# Patient Record
Sex: Male | Born: 1955 | Race: White | Hispanic: No | Marital: Married | Smoking: Current every day smoker
Health system: Southern US, Community
[De-identification: ages and names within clinical notes are randomized; demographics above are authoritative.]

## PROBLEM LIST (undated history)

## (undated) DIAGNOSIS — K5780 Diverticulitis of intestine, part unspecified, with perforation and abscess without bleeding: Secondary | ICD-10-CM

## (undated) DIAGNOSIS — K5732 Diverticulitis of large intestine without perforation or abscess without bleeding: Secondary | ICD-10-CM

---

## 2003-12-14 ENCOUNTER — Other Ambulatory Visit: Payer: Self-pay

## 2009-10-03 ENCOUNTER — Emergency Department: Payer: Self-pay | Admitting: Orthopedic Surgery

## 2011-03-06 ENCOUNTER — Ambulatory Visit: Payer: Self-pay

## 2019-10-23 ENCOUNTER — Ambulatory Visit (INDEPENDENT_AMBULATORY_CARE_PROVIDER_SITE_OTHER)
Admission: EM | Admit: 2019-10-23 | Discharge: 2019-10-23 | Disposition: A | Discharge status: Home / Self Care | Payer: 59 | Source: Home / Self Care | Attending: Family Medicine | Admitting: Family Medicine

## 2019-10-23 ENCOUNTER — Inpatient Hospital Stay
Admission: EM | Admit: 2019-10-23 | Discharge: 2019-10-27 | DRG: 392 | Disposition: A | Discharge status: Home / Self Care | Payer: 59 | Attending: Surgery | Admitting: Surgery

## 2019-10-23 ENCOUNTER — Ambulatory Visit
Admit: 2019-10-23 | Discharge: 2019-10-23 | Disposition: A | Discharge status: Home / Self Care | Payer: 59 | Attending: Family Medicine | Admitting: Family Medicine

## 2019-10-23 ENCOUNTER — Ambulatory Visit (INDEPENDENT_AMBULATORY_CARE_PROVIDER_SITE_OTHER): Payer: 59

## 2019-10-23 ENCOUNTER — Other Ambulatory Visit: Payer: Self-pay

## 2019-10-23 ENCOUNTER — Telehealth: Payer: Self-pay | Admitting: Family Medicine

## 2019-10-23 ENCOUNTER — Encounter: Payer: Self-pay | Admitting: Intensive Care

## 2019-10-23 DIAGNOSIS — K4090 Unilateral inguinal hernia, without obstruction or gangrene, not specified as recurrent: Secondary | ICD-10-CM | POA: Diagnosis present

## 2019-10-23 DIAGNOSIS — R109 Unspecified abdominal pain: Secondary | ICD-10-CM

## 2019-10-23 DIAGNOSIS — R509 Fever, unspecified: Secondary | ICD-10-CM | POA: Diagnosis present

## 2019-10-23 DIAGNOSIS — R1084 Generalized abdominal pain: Secondary | ICD-10-CM | POA: Diagnosis not present

## 2019-10-23 DIAGNOSIS — F17210 Nicotine dependence, cigarettes, uncomplicated: Secondary | ICD-10-CM | POA: Diagnosis present

## 2019-10-23 DIAGNOSIS — K5720 Diverticulitis of large intestine with perforation and abscess without bleeding: Secondary | ICD-10-CM | POA: Diagnosis present

## 2019-10-23 DIAGNOSIS — R143 Flatulence: Secondary | ICD-10-CM | POA: Diagnosis not present

## 2019-10-23 DIAGNOSIS — K5900 Constipation, unspecified: Secondary | ICD-10-CM | POA: Diagnosis present

## 2019-10-23 DIAGNOSIS — R1030 Lower abdominal pain, unspecified: Secondary | ICD-10-CM | POA: Insufficient documentation

## 2019-10-23 DIAGNOSIS — K5792 Diverticulitis of intestine, part unspecified, without perforation or abscess without bleeding: Secondary | ICD-10-CM | POA: Diagnosis present

## 2019-10-23 DIAGNOSIS — N289 Disorder of kidney and ureter, unspecified: Secondary | ICD-10-CM | POA: Diagnosis present

## 2019-10-23 DIAGNOSIS — I700 Atherosclerosis of aorta: Secondary | ICD-10-CM | POA: Diagnosis present

## 2019-10-23 DIAGNOSIS — Z20822 Contact with and (suspected) exposure to covid-19: Secondary | ICD-10-CM | POA: Diagnosis present

## 2019-10-23 LAB — CBC WITH DIFFERENTIAL/PLATELET
Abs Immature Granulocytes: 0.09 10*3/uL — ABNORMAL HIGH (ref 0.00–0.07)
Basophils Absolute: 0 10*3/uL (ref 0.0–0.1)
Basophils Relative: 0 %
Eosinophils Absolute: 0.2 10*3/uL (ref 0.0–0.5)
Eosinophils Relative: 1 %
HCT: 43.3 % (ref 39.0–52.0)
Hemoglobin: 14.8 g/dL (ref 13.0–17.0)
Immature Granulocytes: 1 %
Lymphocytes Relative: 13 %
Lymphs Abs: 1.9 10*3/uL (ref 0.7–4.0)
MCH: 30.4 pg (ref 26.0–34.0)
MCHC: 34.2 g/dL (ref 30.0–36.0)
MCV: 88.9 fL (ref 80.0–100.0)
Monocytes Absolute: 1.3 10*3/uL — ABNORMAL HIGH (ref 0.1–1.0)
Monocytes Relative: 9 %
Neutro Abs: 11.2 10*3/uL — ABNORMAL HIGH (ref 1.7–7.7)
Neutrophils Relative %: 76 %
Platelets: 360 10*3/uL (ref 150–400)
RBC: 4.87 MIL/uL (ref 4.22–5.81)
RDW: 12.2 % (ref 11.5–15.5)
WBC: 14.7 10*3/uL — ABNORMAL HIGH (ref 4.0–10.5)
nRBC: 0 % (ref 0.0–0.2)

## 2019-10-23 LAB — COMPREHENSIVE METABOLIC PANEL
ALT: 64 U/L — ABNORMAL HIGH (ref 0–44)
AST: 46 U/L — ABNORMAL HIGH (ref 15–41)
Albumin: 3.9 g/dL (ref 3.5–5.0)
Alkaline Phosphatase: 79 U/L (ref 38–126)
Anion gap: 8 (ref 5–15)
BUN: 14 mg/dL (ref 8–23)
CO2: 25 mmol/L (ref 22–32)
Calcium: 9 mg/dL (ref 8.9–10.3)
Chloride: 102 mmol/L (ref 98–111)
Creatinine, Ser: 1.23 mg/dL (ref 0.61–1.24)
GFR calc Af Amer: >60 mL/min (ref >60)
GFR calc non Af Amer: >60 mL/min (ref >60)
Glucose, Bld: 101 mg/dL — ABNORMAL HIGH (ref 70–99)
Potassium: 4.2 mmol/L (ref 3.5–5.1)
Sodium: 135 mmol/L (ref 135–145)
Total Bilirubin: 1.8 mg/dL — ABNORMAL HIGH (ref 0.3–1.2)
Total Protein: 7.3 g/dL (ref 6.5–8.1)

## 2019-10-23 LAB — LIPASE, BLOOD: Lipase: 20 U/L (ref 11–51)

## 2019-10-23 LAB — SARS CORONAVIRUS 2 BY RT PCR (HOSPITAL ORDER, PERFORMED IN ~~LOC~~ HOSPITAL LAB): SARS Coronavirus 2: NEGATIVE

## 2019-10-23 LAB — LACTIC ACID, PLASMA: Lactic Acid, Venous: 1 mmol/L (ref 0.5–1.9)

## 2019-10-23 MED ORDER — ONDANSETRON 4 MG PO TBDP
4.0000 mg | ORAL_TABLET | Freq: Four times a day (QID) | ORAL | Status: DC | PRN
  Filled 2019-10-23: qty 1

## 2019-10-23 MED ORDER — PIPERACILLIN-TAZOBACTAM 3.375 G IVPB
3.3750 g | Freq: Three times a day (TID) | INTRAVENOUS | Status: DC
  Administered 2019-10-24 – 2019-10-27 (×9): 3.375 g via INTRAVENOUS
  Filled 2019-10-23 (×9): qty 50

## 2019-10-23 MED ORDER — ENOXAPARIN SODIUM 40 MG/0.4ML ~~LOC~~ SOLN
40.0000 mg | SUBCUTANEOUS | Status: DC
  Administered 2019-10-24 – 2019-10-26 (×3): 40 mg via SUBCUTANEOUS
  Filled 2019-10-23 (×3): qty 0.4

## 2019-10-23 MED ORDER — ACETAMINOPHEN 325 MG PO TABS: 650.0000 mg | ORAL_TABLET | Freq: Four times a day (QID) | ORAL | Status: DC | PRN

## 2019-10-23 MED ORDER — SODIUM CHLORIDE 0.9 % IV SOLN: Freq: Once | INTRAVENOUS | Status: AC

## 2019-10-23 MED ORDER — HYDROCODONE-ACETAMINOPHEN 5-325 MG PO TABS: 1.0000 | ORAL_TABLET | ORAL | Status: DC | PRN

## 2019-10-23 MED ORDER — PANTOPRAZOLE SODIUM 40 MG IV SOLR
40.0000 mg | Freq: Every day | INTRAVENOUS | Status: DC
  Administered 2019-10-23 – 2019-10-26 (×4): 40 mg via INTRAVENOUS
  Filled 2019-10-23 (×4): qty 40

## 2019-10-23 MED ORDER — IOHEXOL 300 MG/ML  SOLN
100.0000 mL | Freq: Once | INTRAMUSCULAR | Status: AC | PRN
  Administered 2019-10-23: 100 mL via INTRAVENOUS

## 2019-10-23 MED ORDER — MORPHINE SULFATE (PF) 2 MG/ML IV SOLN: 2.0000 mg | INTRAVENOUS | Status: DC | PRN

## 2019-10-23 MED ORDER — PIPERACILLIN-TAZOBACTAM 3.375 G IVPB 30 MIN
3.3750 g | Freq: Once | INTRAVENOUS | Status: AC
  Administered 2019-10-23: 3.375 g via INTRAVENOUS
  Filled 2019-10-23 (×2): qty 50

## 2019-10-23 MED ORDER — TRAMADOL HCL 50 MG PO TABS: 50.0000 mg | ORAL_TABLET | Freq: Four times a day (QID) | ORAL | Status: DC | PRN

## 2019-10-23 MED ORDER — ACETAMINOPHEN 325 MG PO TABS
ORAL_TABLET | ORAL | Status: AC
  Administered 2019-10-23: 650 mg via ORAL
  Filled 2019-10-23: qty 2

## 2019-10-23 MED ORDER — DOCUSATE SODIUM 100 MG PO CAPS: 100.0000 mg | ORAL_CAPSULE | Freq: Two times a day (BID) | ORAL | Status: DC | PRN

## 2019-10-23 MED ORDER — SODIUM CHLORIDE 0.9 % IV SOLN: INTRAVENOUS | Status: DC

## 2019-10-23 MED ORDER — ONDANSETRON HCL 4 MG/2ML IJ SOLN: 4.0000 mg | Freq: Four times a day (QID) | INTRAMUSCULAR | Status: DC | PRN

## 2019-10-23 NOTE — ED Triage Notes (Signed)
Pt reports having fever and abd pain x3 days. tmax 101.3 last night, reports taking advil and tylenol. Small BM this morning, last normal BM was Monday.

## 2019-10-23 NOTE — Discharge Instructions (Signed)
Go straight to the medical mall.  I will talk to you soon.  Take care  Dr. Adriana Simas

## 2019-10-23 NOTE — ED Provider Notes (Signed)
[facility]    CSN: [id number] Arrival date & time: 10/23/19  1157      History   Chief Complaint Chief Complaint  Patient presents with  . Fever  . Abdominal Pain   HPI  64 year old male presents with fever and abdominal pain.  Patient reports a 3-day history of abdominal pain and associated fever.  Patient localizes the abdominal pain to the lower abdomen.  He has had a fever as high as 101.3.  He has been taking Tylenol and Advil for his fever.  Patient reports ongoing constipation.  Patient states that he has never been constipated before.  He had a small bowel movement this morning.  No diarrhea.  Pain 4/10 in severity.  No known exacerbating factors.  Patient concerned given lack of improvement and persistent fever.  Denies history of diverticulitis.  Has never had abdominal surgery.  Patient does note that he has an inguinal hernia which is in need of repair.  He does not believe that this is the source of his abdominal pain and fever.  PMH: DDD, cervical; Hernia  Surgical Hx: Denies history of abdominal surgery.  Home Medications    Prior to Admission medications   Not on File   Social History Social History   Tobacco Use  . Smoking status: Current Every Day Smoker  . Smokeless tobacco: Never Used  Substance Use Topics  . Alcohol use: Yes  . Drug use: Never     Allergies   Patient has no allergy information on record.   Review of Systems Review of Systems  Constitutional: Positive for fever.  Gastrointestinal: Positive for abdominal pain and constipation.   Physical Exam Triage Vital Signs ED Triage Vitals  Enc Vitals Group     BP 10/23/19 1225 136/81     Pulse Rate 10/23/19 1225 85     Resp 10/23/19 1225 18     Temp 10/23/19 1225 99.2 F (37.3 C)     Temp Source 10/23/19 1225 Oral     SpO2 10/23/19 1225 96 %     Weight 10/23/19 1226 185 lb (83.9 kg)     Height 10/23/19 1226 5\' 9"  (1.753 m)     Head Circumference --      Peak Flow  --      Pain Score 10/23/19 1225 4     Pain Loc --      Pain Edu? --      Excl. in GC? --    Updated Vital Signs BP 136/81   Pulse 85   Temp 99.2 F (37.3 C) (Oral)   Resp 18   Ht 5\' 9"  (1.753 m)   Wt 83.9 kg   SpO2 96%   BMI 27.32 kg/m   Visual Acuity Right Eye Distance:   Left Eye Distance:   Bilateral Distance:    Right Eye Near:   Left Eye Near:    Bilateral Near:     Physical Exam Vitals and nursing note reviewed.  Constitutional:      General: He is not in acute distress.    Appearance: Normal appearance. He is not ill-appearing.  HENT:     Head: Normocephalic and atraumatic.  Eyes:     General:        Right eye: No discharge.        Left eye: No discharge.     Conjunctiva/sclera: Conjunctivae normal.  Cardiovascular:     Rate and Rhythm: Normal rate and regular rhythm.  Heart sounds: No murmur heard.   Pulmonary:     Effort: Pulmonary effort is normal.     Breath sounds: Normal breath sounds. No wheezing, rhonchi or rales.  Abdominal:     Palpations: Abdomen is soft.     Comments: Appears slightly distended.  Tender to palpation particularly in the periumbilical region as well as the left lower quadrant.  Neurological:     Mental Status: He is alert.  Psychiatric:        Mood and Affect: Mood normal.        Behavior: Behavior normal.    UC Treatments / Results  Labs (all labs ordered are listed, but only abnormal results are displayed) Labs Reviewed  CBC WITH DIFFERENTIAL/PLATELET - Abnormal; Notable for the following components:      Result Value   WBC 14.7 (*)    Neutro Abs 11.2 (*)    Monocytes Absolute 1.3 (*)    Abs Immature Granulocytes 0.09 (*)    All other components within normal limits  COMPREHENSIVE METABOLIC PANEL - Abnormal; Notable for the following components:   Glucose, Bld 101 (*)    AST 46 (*)    ALT 64 (*)    Total Bilirubin 1.8 (*)    All other components within normal limits  LIPASE, BLOOD     EKG   Radiology DG Abd 1 View  Result Date: 10/23/2019 CLINICAL DATA:  Fever and abdominal pain for 3 days. EXAM: ABDOMEN - 1 VIEW COMPARISON:  None. FINDINGS: The bowel gas pattern is normal. No radio-opaque calculi or other significant radiographic abnormality are seen. IMPRESSION: Negative exam. Electronically Signed   By: Drusilla Kanner M.D.   On: 10/23/2019 12:55   EXAM: CT ABDOMEN AND PELVIS WITH CONTRAST  TECHNIQUE: Multidetector CT imaging of the abdomen and pelvis was performed using the standard protocol following bolus administration of intravenous contrast.  CONTRAST: OMNIPAQUE IOHEXOL 300 MG/ML SOLN  COMPARISON: Plain film earlier today  FINDINGS: Lower chest: Clear lung bases. Normal heart size without pericardial or pleural effusion.  Hepatobiliary: Normal liver. Normal gallbladder, without biliary ductal dilatation.  Pancreas: Normal, without mass or ductal dilatation.  Spleen: Normal in size, without focal abnormality.  Adrenals/Urinary Tract: Normal adrenal glands. Fluid density right renal lesions are likely cysts and minimally complex cysts. Other lesions are too small to characterize. A lower pole right renal lesion of 1.7 cm demonstrates complexity along its anterior wall, including on 44/2. No hydronephrosis. The right-side of the urinary bladder enters a right inguinal hernia, including on 76/2.  Stomach/Bowel: Normal stomach, without wall thickening. Moderate pericolonic edema surrounds the sigmoid, at the site of diverticula. Extraluminal gas extends to the sigmoid mesocolon and into the abdominal retroperitoneum, including to the level of the right adrenal gland.  No pericolonic fluid collection. Normal terminal ileum and appendix. Normal small bowel.  Vascular/Lymphatic: Aortic atherosclerosis. No abdominopelvic adenopathy.  Reproductive: Normal prostate.  Other: No significant free fluid.  Musculoskeletal: Degenerative  partial fusion of the right sacroiliac joint.  IMPRESSION: 1. Moderate sigmoid diverticulitis with perforation and gas extending into the abdominal retroperitoneum. 2. Right inguinal hernia containing fat and a portion of the urinary bladder. 3. Complex lower pole right renal lesion, most likely a minimally complex cyst. Consider further evaluation with pre and post contrast abdominal MRI at 6-12 months. 4. Aortic Atherosclerosis (ICD10-I70.0).  These results will be called to the ordering clinician or representative by the Radiologist Assistant, and communication documented in the PACS or [employer].  Electronically Signed By: Jeronimo Greaves M.D. On: 10/23/2019 17:11  Procedures Procedures (including critical care time)  Medications Ordered in UC Medications - No data to display  Initial Impression / Assessment and Plan / UC Course  I have reviewed the triage vital signs and the nursing notes.  Pertinent labs & imaging results that were available during my care of the patient were reviewed by me and considered in my medical decision making (see chart for details).    64 year old male presents with fever and abdominal pain.  CT unavailable at urgent care today.  Laboratory studies obtained and revealed leukocytosis with left shift with a white blood cell count of 14.7.  Metabolic panel notable for mildly elevated LFTs as well as bilirubin of 1.8.  Creatinine within normal limits.  KUB obtained and independently interpreted by me.  Normal gas pattern.  No evidence of obstruction.  Given history, clinical exam and laboratory studies patient is being sent for CT abdomen pelvis with contrast (at [facility]) to further evaluate the etiology of his abdominal pain and fever.  ** Update - 1720: CT results return. Patient has diverticulitis with perforation. CT transporting patient to ER.  ER notified.  This is an emergent condition that will require hospitalization and surgical  evaluation.  Final Clinical Impressions(s) / UC Diagnoses   Final diagnoses:  Abdominal pain  Lower abdominal pain  Fever, unspecified fever cause     Discharge Instructions     Go straight to the medical mall.  I will talk to you soon.  Take care  Dr. Adriana Simas    ED Prescriptions    None     PDMP not reviewed this encounter.   Tommie Sams, [place] 10/23/19 1724

## 2019-10-23 NOTE — Telephone Encounter (Signed)
PA # for CT scan: [id number] effective 10/23/2019-01/21/2020

## 2019-10-23 NOTE — ED Provider Notes (Signed)
[facility] Emergency Department Provider Note ____________________________________________   First MD Initiated Contact with Patient 10/23/19 1742     (approximate)  I have reviewed the triage vital signs and the nursing notes.   HISTORY  Chief Complaint Abdominal Pain    HPI Joshua Hayes is a 64 y.o. male with no active medical problems and no prior surgical history who presents with abdominal pain over the last 4 days, gradual onset, mainly in the left lower quadrant, and associated with a fever to as high as 101 as well as constipation.  The patient denies any prior history of similar pain.  He has had no vomiting or diarrhea.  He was seen at urgent care today, and a CT showed perforated diverticulitis.  History reviewed. No pertinent past medical history.  Patient Active Problem List   Diagnosis Date Noted  . Diverticulitis 10/23/2019    History reviewed. No pertinent surgical history.  Prior to Admission medications   Medication Sig Start Date End Date Taking? Authorizing Provider  tiZANidine (ZANAFLEX) 4 MG capsule Take by mouth. 09/10/19  Yes [provider]    Allergies Patient has no known allergies.  History reviewed. No pertinent family history.  Social History Social History   Tobacco Use  . Smoking status: Current Every Day Smoker    Types: Cigarettes  . Smokeless tobacco: Never Used  Substance Use Topics  . Alcohol use: Yes    Alcohol/week: 2.0 standard drinks    Types: 2 Cans of beer per week  . Drug use: Never    Review of Systems  Constitutional: Positive for fevers. Eyes: No redness. ENT: No sore throat. Cardiovascular: Denies chest pain. Respiratory: Denies shortness of breath. Gastrointestinal: No vomiting or diarrhea.  Positive constipation. Genitourinary: Negative for dysuria.  Musculoskeletal: Negative for back pain. Skin: Negative for rash. Neurological: Negative for  headache.   ____________________________________________   PHYSICAL EXAM:  VITAL SIGNS: ED Triage Vitals  Enc Vitals Group     BP      Pulse      Resp      Temp      Temp src      SpO2      Weight      Height      Head Circumference      Peak Flow      Pain Score      Pain Loc      Pain Edu?      Excl. in GC?     Constitutional: Alert and oriented. Well appearing and in no acute distress. Eyes: Conjunctivae are normal.  Head: Atraumatic. Nose: No congestion/rhinnorhea. Mouth/Throat: Mucous membranes are moist.   Neck: Normal range of motion.  Cardiovascular: Normal rate, regular rhythm.   Good peripheral circulation. Respiratory: Normal respiratory effort.  No retractions.  Gastrointestinal: Soft with mild left lower quadrant tenderness. Genitourinary: No flank tenderness. Musculoskeletal: No lower extremity edema.  Extremities warm and well perfused.  Neurologic:  Normal speech and language. No gross focal neurologic deficits are appreciated.  Skin:  Skin is warm and dry. No rash noted. Psychiatric: Mood and affect are normal. Speech and behavior are normal.  ____________________________________________   LABS (all labs ordered are listed, but only abnormal results are displayed)  Labs Reviewed  SARS CORONAVIRUS 2 BY RT PCR (HOSPITAL ORDER, PERFORMED IN [facility])  LACTIC ACID, PLASMA  LACTIC ACID, PLASMA  HIV ANTIBODY (ROUTINE TESTING W REFLEX)  BASIC METABOLIC PANEL  MAGNESIUM  PHOSPHORUS  CBC   ____________________________________________  EKG  ED ECG REPORT I, Dionne Bucy, the attending physician, personally viewed and interpreted this ECG.  Date: 10/23/2019 EKG Time: 1816 Rate: 81 Rhythm: normal sinus rhythm QRS Axis: Borderline left axis Intervals: normal ST/T Wave abnormalities: normal Narrative Interpretation: no evidence of acute  ischemia  ____________________________________________  RADIOLOGY    ____________________________________________   PROCEDURES  Procedure(s) performed: No  Procedures  Critical Care performed: No ____________________________________________   INITIAL IMPRESSION / ASSESSMENT AND PLAN / ED COURSE  Pertinent labs & imaging results that were available during my care of the patient were reviewed by me and considered in my medical decision making (see chart for details).  64 year old male with no active medical problems and no previous abdominal surgical history presents with abdominal pain over the last several days associated with constipation and fever.  He was seen in urgent care, and CT shows the following:  IMPRESSION: 1. Moderate sigmoid diverticulitis with perforation and gas extending into the abdominal retroperitoneum. 2. Right inguinal hernia containing fat and a portion of the urinary bladder. 3. Complex lower pole right renal lesion, most likely a minimally complex cyst. Consider further evaluation with pre and post contrast abdominal MRI at 6-12 months.  Labs earlier today are significant for WBC count of 14.7.  I will add on a lactate, give fluids, empiric antibiotics, and consult general surgery.  ----------------------------------------- 7:41 PM on 10/23/2019 -----------------------------------------  Dr. Tonna Boehringer evaluated the patient in the ED and will admit him.  ____________________________________________   FINAL CLINICAL IMPRESSION(S) / ED DIAGNOSES  Final diagnoses:  Diverticulitis of large intestine with perforation without bleeding      NEW MEDICATIONS STARTED DURING THIS VISIT:  New Prescriptions   No medications on file     Note:  This document was prepared using Dragon voice recognition software and may include unintentional dictation errors.    Dionne Bucy, MD 10/23/19 1942

## 2019-10-23 NOTE — ED Triage Notes (Signed)
Pt sent for abnormal CT scan. Fevers and abd pain X3 days. Last normal BM was monday

## 2019-10-23 NOTE — H&P (Signed)
Subjective:   CC: Diverticulitis  HPI:  Joshua Hayes is a 64 y.o. male who was consulted by [facility] for evaluation of above.  First noted 3 days ago.  Symptoms include: Pain is sharp, intermittent, exacerbating episodes but baseline pain.  Exacerbated by nothing specific.  Alleviated by nothing specific.  Associated with diarrhea.  Never had a colonoscopy before   Past Medical History: None reported  Past Surgical History: None reported  Family History: Reviewed and not relevant to current chief complaint  Social History:  reports that he has been smoking cigarettes. He has never used smokeless tobacco. He reports current alcohol use of about 2.0 standard drinks of alcohol per week. He reports that he does not use drugs.  Current Medications: Tylenol Motrin as needed  Allergies:  No Known Allergies  ROS:  General: Denies weight loss, weight gain, fatigue, fevers, chills, and night sweats. Eyes: Denies blurry vision, double vision, eye pain, itchy eyes, and tearing. Ears: Denies hearing loss, earache, and ringing in ears. Nose: Denies sinus pain, congestion, infections, runny nose, and nosebleeds. Mouth/throat: Denies hoarseness, sore throat, bleeding gums, and difficulty swallowing. Heart: Denies chest pain, palpitations, racing heart, irregular heartbeat, leg pain or swelling, and decreased activity tolerance. Respiratory: Denies breathing difficulty, shortness of breath, wheezing, cough, and sputum. GI: Denies change in appetite, heartburn, nausea, vomiting, constipation, and blood in stool. GU: Denies difficulty urinating, pain with urinating, urgency, frequency, blood in urine. Musculoskeletal: Denies joint stiffness, pain, swelling, muscle weakness. Skin: Denies rash, itching, mass, tumors, sores, and boils Neurologic: Denies headache, fainting, dizziness, seizures, numbness, and tingling. Psychiatric: Denies depression, anxiety, difficulty sleeping, and memory  loss. Endocrine: Denies heat or cold intolerance, and increased thirst or urination. Blood/lymph: Denies easy bruising, easy bruising, and swollen glands     Objective:     BP (!) 143/93 (BP Location: Right Arm)   Pulse 81   Temp 98.4 F (36.9 C) (Oral)   Resp 18   Ht 5\' 9"  (1.753 m)   Wt 83.9 kg   SpO2 97%   BMI 27.32 kg/m   Constitutional :  alert, cooperative, appears stated age and no distress  Lymphatics/Throat:  no asymmetry, masses, or scars  Respiratory:  clear to auscultation bilaterally  Cardiovascular:  regular rate and rhythm  Gastrointestinal: abnormal findings:  Soft, no guarding, but moderate focal tenderness in the left lower quadrant and suprapubic area.  Musculoskeletal: Steady gait and movement  Skin: Cool and moist  Psychiatric: Normal affect, non-agitated, not confused       LABS:  CMP Latest Ref Rng & Units 10/23/2019  Glucose 70 - 99 mg/dL 10/25/2019)  BUN 8 - 23 mg/dL 14  Creatinine 220(U - 5.42 mg/dL 7.06  Sodium 2.37 - 628 mmol/L 135  Potassium 3.5 - 5.1 mmol/L 4.2  Chloride 98 - 111 mmol/L 102  CO2 22 - 32 mmol/L 25  Calcium 8.9 - 10.3 mg/dL 9.0  Total Protein 6.5 - 8.1 g/dL 7.3  Total Bilirubin 0.3 - 1.2 mg/dL 315)  Alkaline Phos 38 - 126 U/L 79  AST 15 - 41 U/L 46(H)  ALT 0 - 44 U/L 64(H)   CBC Latest Ref Rng & Units 10/23/2019  WBC 4.0 - 10.5 K/uL 14.7(H)  Hemoglobin 13.0 - 17.0 g/dL 10/25/2019  Hematocrit 39 - 52 % 43.3  Platelets 150 - 400 K/uL 360    RADS: Result Date: 10/23/2019 CLINICAL DATA:  Fever and abdominal pain for 3 days. EXAM: ABDOMEN - 1 VIEW COMPARISON:  None. FINDINGS: The bowel gas pattern is normal. No radio-opaque calculi or other significant radiographic abnormality are seen. IMPRESSION: Negative exam. Electronically Signed   By: Drusilla Kanner M.D.   On: 10/23/2019 12:55    EXAM: CT ABDOMEN AND PELVIS WITH CONTRAST  TECHNIQUE: Multidetector CT imaging of the abdomen and pelvis was performed using the standard  protocol following bolus administration of intravenous contrast.  CONTRAST: OMNIPAQUE IOHEXOL 300 MG/ML SOLN  COMPARISON: Plain film earlier today  FINDINGS: Lower chest: Clear lung bases. Normal heart size without pericardial or pleural effusion.  Hepatobiliary: Normal liver. Normal gallbladder, without biliary ductal dilatation.  Pancreas: Normal, without mass or ductal dilatation.  Spleen: Normal in size, without focal abnormality.  Adrenals/Urinary Tract: Normal adrenal glands. Fluid density right renal lesions are likely cysts and minimally complex cysts. Other lesions are too small to characterize. A lower pole right renal lesion of 1.7 cm demonstrates complexity along its anterior wall, including on 44/2. No hydronephrosis. The right-side of the urinary bladder enters a right inguinal hernia, including on 76/2.  Stomach/Bowel: Normal stomach, without wall thickening. Moderate pericolonic edema surrounds the sigmoid, at the site of diverticula. Extraluminal gas extends to the sigmoid mesocolon and into the abdominal retroperitoneum, including to the level of the right adrenal gland.  No pericolonic fluid collection. Normal terminal ileum and appendix. Normal small bowel.  Vascular/Lymphatic: Aortic atherosclerosis. No abdominopelvic adenopathy.  Reproductive: Normal prostate.  Other: No significant free fluid.  Musculoskeletal: Degenerative partial fusion of the right sacroiliac joint.  IMPRESSION: 1. Moderate sigmoid diverticulitis with perforation and gas extending into the abdominal retroperitoneum. 2. Right inguinal hernia containing fat and a portion of the urinary bladder. 3. Complex lower pole right renal lesion, most likely a minimally complex cyst. Consider further evaluation with pre and post contrast abdominal MRI at 6-12 months. 4. Aortic Atherosclerosis (ICD10-I70.0).  These results will be called to the ordering clinician  or representative by the Radiologist Assistant, and communication documented in the PACS or [employer].   Electronically Signed By: Jeronimo Greaves M.D. On: 10/23/2019 17:11   Assessment:      Acute diverticulitis with perforation Right renal lesion, likely cyst  Plan:     Despite the leukocytosis, and the moderate amount of pneumoperitoneum extending along the retroperitoneal cavity, patient vitals remained stable, total duration of symptoms has been 3 days, and the patient is tender to palpation but in no way has an acute abdomen.  We discussed the risk benefits and alternatives at this point including observation with IV antibiotics versus proceeding with surgical management.  Surgical management at this point will have a high risk of requiring an ostomy.  I believe that we can give IV antibiotics to try for the next 24 to 48 hours to see if he has any significant improvement in his symptoms prior to really pushing for surgery.  Patient is agreeable to that at this time especially with the potential need for a temporary ostomy after such surgery.  We will admit for IV fluids, Zosyn, n.p.o. for now, serial abdominal exams.  Incidental note of right renal lesion, likely cyst per report.  Will make sure he has follow-up MRI in 6 to 12 months to ensure no change.  All questions and concerns addressed at this time.

## 2019-10-24 LAB — CBC
HCT: 41 % (ref 39.0–52.0)
Hemoglobin: 13.9 g/dL (ref 13.0–17.0)
MCH: 30.8 pg (ref 26.0–34.0)
MCHC: 33.9 g/dL (ref 30.0–36.0)
MCV: 90.9 fL (ref 80.0–100.0)
Platelets: 326 10*3/uL (ref 150–400)
RBC: 4.51 MIL/uL (ref 4.22–5.81)
RDW: 12.1 % (ref 11.5–15.5)
WBC: 11.5 10*3/uL — ABNORMAL HIGH (ref 4.0–10.5)
nRBC: 0 % (ref 0.0–0.2)

## 2019-10-24 LAB — BASIC METABOLIC PANEL
Anion gap: 6 (ref 5–15)
BUN: 12 mg/dL (ref 8–23)
CO2: 28 mmol/L (ref 22–32)
Calcium: 8.9 mg/dL (ref 8.9–10.3)
Chloride: 104 mmol/L (ref 98–111)
Creatinine, Ser: 1.27 mg/dL — ABNORMAL HIGH (ref 0.61–1.24)
GFR calc Af Amer: >60 mL/min (ref >60)
GFR calc non Af Amer: >60 mL/min (ref >60)
Glucose, Bld: 84 mg/dL (ref 70–99)
Potassium: 4.9 mmol/L (ref 3.5–5.1)
Sodium: 138 mmol/L (ref 135–145)

## 2019-10-24 LAB — MAGNESIUM: Magnesium: 2.1 mg/dL (ref 1.7–2.4)

## 2019-10-24 LAB — HIV ANTIBODY (ROUTINE TESTING W REFLEX): HIV Screen 4th Generation wRfx: NONREACTIVE

## 2019-10-24 LAB — PHOSPHORUS: Phosphorus: 3.5 mg/dL (ref 2.5–4.6)

## 2019-10-24 MED ORDER — MELATONIN 5 MG PO TABS
10.0000 mg | ORAL_TABLET | Freq: Every evening | ORAL | Status: DC | PRN
  Administered 2019-10-24 – 2019-10-26 (×3): 10 mg via ORAL
  Filled 2019-10-24 (×3): qty 2

## 2019-10-24 NOTE — Progress Notes (Signed)
Subjective:  CC: Joshua Hayes is a 64 y.o. male  Hospital stay day 1,   diverticulitis  HPI: No issues overnight.  Pain improved.  ROS:  General: Denies weight loss, weight gain, fatigue, fevers, chills, and night sweats. Heart: Denies chest pain, palpitations, racing heart, irregular heartbeat, leg pain or swelling, and decreased activity tolerance. Respiratory: Denies breathing difficulty, shortness of breath, wheezing, cough, and sputum. GI: Denies change in appetite, heartburn, nausea, vomiting, constipation, diarrhea, and blood in stool. GU: Denies difficulty urinating, pain with urinating, urgency, frequency, blood in urine.   Objective:   Temp:  [97.9 F (36.6 C)-99.5 F (37.5 C)] 98.7 F (37.1 C) (07/16 1128) Pulse Rate:  [68-95] 73 (07/16 1128) Resp:  [18-20] 18 (07/16 1128) BP: (109-155)/(67-96) 116/77 (07/16 1128) SpO2:  [96 %-100 %] 98 % (07/16 1128) Weight:  [83.2 kg-83.9 kg] 83.2 kg (07/15 2351)     Height: 5\' 9"  (175.3 cm) Weight: 83.2 kg BMI (Calculated): 27.09   Intake/Output this shift:   Intake/Output Summary (Last 24 hours) at 10/24/2019 1541 Last data filed at 10/24/2019 1201 Gross per 24 hour  Intake 302.17 ml  Output 750 ml  Net -447.83 ml    Constitutional :  alert, cooperative, appears stated age and no distress  Respiratory:  clear to auscultation bilaterally  Cardiovascular:  regular rate and rhythm  Gastrointestinal: soft, no guarding, TTP in suprapubic, LLQ, improved but still present on exam.   Skin: Cool and moist.   Psychiatric: Normal affect, non-agitated, not confused       LABS:  CMP Latest Ref Rng & Units 10/24/2019 10/23/2019  Glucose 70 - 99 mg/dL 84 10/25/2019)  BUN 8 - 23 mg/dL 12 14  Creatinine 818(H - 1.24 mg/dL 6.31) 4.97(W  Sodium 2.63 - 145 mmol/L 138 135  Potassium 3.5 - 5.1 mmol/L 4.9 4.2  Chloride 98 - 111 mmol/L 104 102  CO2 22 - 32 mmol/L 28 25  Calcium 8.9 - 10.3 mg/dL 8.9 9.0  Total Protein 6.5 - 8.1 g/dL - 7.3  Total  Bilirubin 0.3 - 1.2 mg/dL - 1.8(H)  Alkaline Phos 38 - 126 U/L - 79  AST 15 - 41 U/L - 46(H)  ALT 0 - 44 U/L - 64(H)   CBC Latest Ref Rng & Units 10/24/2019 10/23/2019  WBC 4.0 - 10.5 K/uL 11.5(H) 14.7(H)  Hemoglobin 13.0 - 17.0 g/dL 10/25/2019 88.5  Hematocrit 39 - 52 % 41.0 43.3  Platelets 150 - 400 K/uL 326 360    RADS: n/a Assessment:   Diverticulitis.  Wbc count and tenderness decreasing.  Continue IV abx.  Maybe clears later tonight, but likely tomorrow.  Will like to go slow due to extensive nature noted on CT.

## 2019-10-24 NOTE — Progress Notes (Signed)
Mobility Specialist - Progress Note   10/24/19 0927  Mobility  Activity Ambulated in hall;Ambulated in room  Level of Assistance Independent (CGA for safety)  Assistive Device None  Distance Ambulated (ft) 210 ft  Mobility Response Tolerated well  Mobility performed by Mobility specialist  $Mobility charge 1 Mobility    Pre-mobility: 73 HR, 122/81 BP, 99% SpO2 Post-mobility: 73 HR, 139/74 BP, 98% SpO2  Pt was in bed upon arrival. Denies having pain, only discomfort, but agreed to ambulate. Pt was able to mobilize and ambulate independently with CGA for safety. After session, pt laid back in bed with call bell in reach. Nurse was notified.    [name] Mobility Specialist  10/24/19, 9:36 AM

## 2019-10-24 NOTE — Plan of Care (Signed)
  Problem: Education: Goal: Knowledge of General Education information will improve Description Including pain rating scale, medication(s)/side effects and non-pharmacologic comfort measures Outcome: Progressing   

## 2019-10-24 NOTE — Progress Notes (Signed)
Initial Nutrition Assessment  DOCUMENTATION CODES:   Not applicable  INTERVENTION:  Monitor for diet advancement per surgery recommendations, provide oral nutrition supplement as appropriate.    NUTRITION DIAGNOSIS:   Inadequate oral intake related to acute illness (acute diverticulits with perforation) as evidenced by NPO status.   GOAL:   Patient will meet greater than or equal to 90% of their needs    MONITOR:   Diet advancement, Labs, Weight trends, I & O's  REASON FOR ASSESSMENT:   Malnutrition Screening Tool    ASSESSMENT:  RD working remotely.  64 year old male with no significant past medical history admitted with perforated diverticulitis presented with 4 day history of abdominal pain associated with fever and constipation.  Surgery consulted, surgical management will have high risk of requiring ostomy, plans for IV antibiotics for the next 24-48 hrs to see if pt has improvement before surgical intervention. Patient is NPO, will monitor for diet advancement and provide oral nutrition supplement as appropriate.   No weight history available for review Medications reviewed and include: IVF: NaCl @ 100 ml/hr IVPB: Zosyn Labs: Cr 1.27 (H), WBC 11.5 (H)   NUTRITION - FOCUSED PHYSICAL EXAM: Unable to complete at this time, RD working remotely.  Diet Order:   Diet Order            Diet NPO time specified Except for: Sips with Meds  Diet effective now                 EDUCATION NEEDS:   No education needs have been identified at this time  Skin:  Skin Assessment: Reviewed RN Assessment  Last BM:  pta  Height:   Ht Readings from Last 1 Encounters:  10/23/19 5\' 9"  (1.753 m)    Weight:   Wt Readings from Last 1 Encounters:  10/23/19 83.2 kg    Ideal Body Weight:  72.7 kg  BMI:  Body mass index is 27.1 kg/m.  Estimated Nutritional Needs:   Kcal:  2100-2300  Protein:  105-115  Fluid:  >/= 2.1 L/day   10/25/19, RD,  LDN Clinical Nutrition After Hours/Weekend Pager # in Amion

## 2019-10-25 DIAGNOSIS — K5720 Diverticulitis of large intestine with perforation and abscess without bleeding: Principal | ICD-10-CM

## 2019-10-25 LAB — CBC
HCT: 41 % (ref 39.0–52.0)
Hemoglobin: 14.3 g/dL (ref 13.0–17.0)
MCH: 30.9 pg (ref 26.0–34.0)
MCHC: 34.9 g/dL (ref 30.0–36.0)
MCV: 88.6 fL (ref 80.0–100.0)
Platelets: 365 10*3/uL (ref 150–400)
RBC: 4.63 MIL/uL (ref 4.22–5.81)
RDW: 12.1 % (ref 11.5–15.5)
WBC: 10.4 10*3/uL (ref 4.0–10.5)
nRBC: 0 % (ref 0.0–0.2)

## 2019-10-25 LAB — BASIC METABOLIC PANEL
Anion gap: 10 (ref 5–15)
BUN: 17 mg/dL (ref 8–23)
CO2: 26 mmol/L (ref 22–32)
Calcium: 9.1 mg/dL (ref 8.9–10.3)
Chloride: 102 mmol/L (ref 98–111)
Creatinine, Ser: 1.27 mg/dL — ABNORMAL HIGH (ref 0.61–1.24)
GFR calc Af Amer: >60 mL/min (ref >60)
GFR calc non Af Amer: >60 mL/min (ref >60)
Glucose, Bld: 91 mg/dL (ref 70–99)
Potassium: 3.9 mmol/L (ref 3.5–5.1)
Sodium: 138 mmol/L (ref 135–145)

## 2019-10-25 LAB — MAGNESIUM: Magnesium: 2.3 mg/dL (ref 1.7–2.4)

## 2019-10-25 LAB — PHOSPHORUS: Phosphorus: 3.8 mg/dL (ref 2.5–4.6)

## 2019-10-25 MED ORDER — SIMETHICONE 80 MG PO CHEW
80.0000 mg | CHEWABLE_TABLET | Freq: Four times a day (QID) | ORAL | Status: DC | PRN
  Administered 2019-10-26: 80 mg via ORAL
  Filled 2019-10-25 (×3): qty 1

## 2019-10-25 NOTE — Progress Notes (Signed)
10/25/2019  Subjective: No acute events overnight.  Patient reports that he is feeling better today and pain is continuing to improve.  Vital signs: Temp:  [97.9 F (36.6 C)-99.1 F (37.3 C)] 97.9 F (36.6 C) (07/17 1135) Pulse Rate:  [65-84] 84 (07/17 1135) Resp:  [17-20] 20 (07/17 1135) BP: (109-119)/(64-75) 111/75 (07/17 1135) SpO2:  [96 %-98 %] 96 % (07/17 0718) Weight:  [82.1 kg] 82.1 kg (07/17 0513)   Intake/Output: 07/16 0701 - 07/17 0700 In: 744.6 [I.V.:668.8; IV Piggyback:75.8] Out: 950 [Urine:950] Last BM Date: 10/23/19  Physical Exam: Constitutional: No acute distress Abdomen: Soft, nondistended, with mild tenderness to palpation but not peritoneal at this point.  Labs:  Recent Labs    10/24/19 0408 10/25/19 0303  WBC 11.5* 10.4  HGB 13.9 14.3  HCT 41.0 41.0  PLT 326 365   Recent Labs    10/24/19 0408 10/25/19 0303  NA 138 138  K 4.9 3.9  CL 104 102  CO2 28 26  GLUCOSE 84 91  BUN 12 17  CREATININE 1.27* 1.27*  CALCIUM 8.9 9.1   No results for input(s): LABPROT, INR in the last 72 hours.  Imaging: No results found.  Assessment/Plan: This is a 64 y.o. male with acute diverticulitis with perforation and foci of gas extending to the retroperitoneum.  -Currently patient is improving well with conservative management.  His white blood cell count has normalized.  His pain is improving. -Continue clear liquid diet today.  We will advance very slowly given his presentation. -Continue IV antibiotics.   Howie Ill, MD [facility]

## 2019-10-26 LAB — CBC
HCT: 40.6 % (ref 39.0–52.0)
Hemoglobin: 14.3 g/dL (ref 13.0–17.0)
MCH: 31 pg (ref 26.0–34.0)
MCHC: 35.2 g/dL (ref 30.0–36.0)
MCV: 88.1 fL (ref 80.0–100.0)
Platelets: 372 10*3/uL (ref 150–400)
RBC: 4.61 MIL/uL (ref 4.22–5.81)
RDW: 11.9 % (ref 11.5–15.5)
WBC: 9.7 10*3/uL (ref 4.0–10.5)
nRBC: 0 % (ref 0.0–0.2)

## 2019-10-26 LAB — BASIC METABOLIC PANEL
Anion gap: 8 (ref 5–15)
BUN: 14 mg/dL (ref 8–23)
CO2: 25 mmol/L (ref 22–32)
Calcium: 9 mg/dL (ref 8.9–10.3)
Chloride: 106 mmol/L (ref 98–111)
Creatinine, Ser: 1.33 mg/dL — ABNORMAL HIGH (ref 0.61–1.24)
GFR calc Af Amer: >60 mL/min (ref >60)
GFR calc non Af Amer: 57 mL/min — ABNORMAL LOW (ref >60)
Glucose, Bld: 105 mg/dL — ABNORMAL HIGH (ref 70–99)
Potassium: 4 mmol/L (ref 3.5–5.1)
Sodium: 139 mmol/L (ref 135–145)

## 2019-10-26 LAB — MAGNESIUM: Magnesium: 2.2 mg/dL (ref 1.7–2.4)

## 2019-10-26 LAB — PHOSPHORUS: Phosphorus: 4.1 mg/dL (ref 2.5–4.6)

## 2019-10-26 NOTE — Plan of Care (Signed)
Continuing with plan of care. 

## 2019-10-26 NOTE — Progress Notes (Signed)
10/26/2019  Subjective: No acute events overnight.  Patient reports that he has some gas pains yesterday but he still having flatus and had 2 bowel movements.  WBC remains normal this morning.  No fevers.  Reports that his pain overall is better today.  Vital signs: Temp:  [97.9 F (36.6 C)-99.3 F (37.4 C)] 98.6 F (37 C) (07/18 0733) Pulse Rate:  [70-84] 71 (07/18 0733) Resp:  [18-20] 20 (07/18 0733) BP: (107-118)/(69-83) 118/83 (07/18 0733) SpO2:  [95 %-100 %] 95 % (07/18 0733) Weight:  [81.9 kg] 81.9 kg (07/18 0419)   Intake/Output: 07/17 0701 - 07/18 0700 In: 464.3 [P.O.:240; IV Piggyback:224.3] Out: 300 [Urine:300] Last BM Date: 10/25/19  Physical Exam: Constitutional: No acute distress Abdomen: Soft, nondistended, with mild discomfort to palpation in the periumbilical and left lower quadrant areas.  No peritonitis.  Labs:  Recent Labs    10/25/19 0303 10/26/19 0414  WBC 10.4 9.7  HGB 14.3 14.3  HCT 41.0 40.6  PLT 365 372   Recent Labs    10/25/19 0303 10/26/19 0414  NA 138 139  K 3.9 4.0  CL 102 106  CO2 26 25  GLUCOSE 91 105*  BUN 17 14  CREATININE 1.27* 1.33*  CALCIUM 9.1 9.0   No results for input(s): LABPROT, INR in the last 72 hours.  Imaging: No results found.  Assessment/Plan: This is a 64 y.o. male with diverticulitis with perforation foci of gas extending to the retroperitoneum.  --Patient improving overall with normal white blood cell count today again. -We will advance diet to full liquid diet and keep that for today. -Continue IV antibiotics.   Howie Ill, MD [facility]

## 2019-10-27 LAB — CBC
HCT: 41.3 % (ref 39.0–52.0)
Hemoglobin: 14.5 g/dL (ref 13.0–17.0)
MCH: 31.2 pg (ref 26.0–34.0)
MCHC: 35.1 g/dL (ref 30.0–36.0)
MCV: 88.8 fL (ref 80.0–100.0)
Platelets: 373 10*3/uL (ref 150–400)
RBC: 4.65 MIL/uL (ref 4.22–5.81)
RDW: 11.9 % (ref 11.5–15.5)
WBC: 9.3 10*3/uL (ref 4.0–10.5)
nRBC: 0 % (ref 0.0–0.2)

## 2019-10-27 LAB — BASIC METABOLIC PANEL
Anion gap: 7 (ref 5–15)
BUN: 12 mg/dL (ref 8–23)
CO2: 27 mmol/L (ref 22–32)
Calcium: 9.2 mg/dL (ref 8.9–10.3)
Chloride: 104 mmol/L (ref 98–111)
Creatinine, Ser: 1.42 mg/dL — ABNORMAL HIGH (ref 0.61–1.24)
GFR calc Af Amer: >60 mL/min (ref >60)
GFR calc non Af Amer: 53 mL/min — ABNORMAL LOW (ref >60)
Glucose, Bld: 106 mg/dL — ABNORMAL HIGH (ref 70–99)
Potassium: 3.9 mmol/L (ref 3.5–5.1)
Sodium: 138 mmol/L (ref 135–145)

## 2019-10-27 LAB — MAGNESIUM: Magnesium: 2.1 mg/dL (ref 1.7–2.4)

## 2019-10-27 LAB — PHOSPHORUS: Phosphorus: 4.4 mg/dL (ref 2.5–4.6)

## 2019-10-27 MED ORDER — AMOXICILLIN-POT CLAVULANATE 875-125 MG PO TABS: 1.0000 | ORAL_TABLET | Freq: Two times a day (BID) | ORAL | 0 refills | Status: AC

## 2019-10-27 MED ORDER — LACTATED RINGERS IV BOLUS
500.0000 mL | Freq: Once | INTRAVENOUS | Status: AC
  Administered 2019-10-27: 500 mL via INTRAVENOUS

## 2019-10-27 NOTE — Discharge Summary (Signed)
Physician Discharge Summary  Patient ID: Joshua Hayes MRN: [id number] DOB/AGE: November 16, 1955 64 y.o.  Admit date: 10/23/2019 Discharge date: 10/27/19   Admission Diagnoses: diverticuilitis  Discharge Diagnoses:  Same as above  Discharged Condition: good  Hospital Course: admitted for above.  Recovered with IV abx and bowel rest, then gradual advancement of diet.  At time of discharge, tolerating regular food, pain controlled.  Cr level noted to be slightly increasing, recommended increased fluid intake.  Will recheck at next appt.  Consults: None  Discharge Exam: Blood pressure 114/73, pulse 78, temperature 98.3 F (36.8 C), temperature source Oral, resp. rate 16, height 5\' 9"  (1.753 m), weight 81.9 kg, SpO2 96 %. General appearance: alert, cooperative and no distress GI: soft, no guarding, barely noticeable TTP in LLQ  Disposition:  Discharge disposition: 01-Home or Self Care       Discharge Instructions    Discharge patient   Complete by: As directed    Discharge disposition: 01-Home or Self Care   Discharge patient date: 10/27/2019     Allergies as of 10/27/2019   No Known Allergies     Medication List    TAKE these medications   amoxicillin-clavulanate 875-125 MG tablet Commonly known as: Augmentin Take 1 tablet by mouth 2 (two) times daily for 6 days.   tiZANidine 4 MG capsule Commonly known as: ZANAFLEX Take by mouth.       Follow-up Information    10/29/2019, Everardo Voris, DO Follow up in 2 week(s).   Specialty: Surgery Why: f/u diverticulitis Contact information: [address] [place] [phone]                Total time spent arranging discharge was >62min. Signed: 31m 10/27/2019, 7:32 AM

## 2019-10-27 NOTE — Discharge Instructions (Signed)

## 2019-10-27 NOTE — Progress Notes (Signed)
Pt discharged per MD order. IV removed. Discharge instructions reviewed with pt. Pt verbalized understanding. Pt declined wheelchair and chose to walk downstairs to car.

## 2019-11-11 ENCOUNTER — Other Ambulatory Visit: Payer: Self-pay

## 2019-11-11 ENCOUNTER — Encounter: Payer: Self-pay | Admitting: Intensive Care

## 2019-11-11 ENCOUNTER — Emergency Department
Admission: EM | Admit: 2019-11-11 | Discharge: 2019-11-12 | Disposition: A | Discharge status: Home / Self Care | Payer: 59 | Attending: Emergency Medicine | Admitting: Emergency Medicine

## 2019-11-11 DIAGNOSIS — R1030 Lower abdominal pain, unspecified: Secondary | ICD-10-CM | POA: Diagnosis present

## 2019-11-11 DIAGNOSIS — Z5321 Procedure and treatment not carried out due to patient leaving prior to being seen by health care provider: Secondary | ICD-10-CM | POA: Insufficient documentation

## 2019-11-11 DIAGNOSIS — R509 Fever, unspecified: Secondary | ICD-10-CM | POA: Diagnosis not present

## 2019-11-11 HISTORY — DX: Diverticulitis of intestine, part unspecified, with perforation and abscess without bleeding: K57.80

## 2019-11-11 LAB — COMPREHENSIVE METABOLIC PANEL
ALT: 21 U/L (ref 0–44)
AST: 20 U/L (ref 15–41)
Albumin: 4.2 g/dL (ref 3.5–5.0)
Alkaline Phosphatase: 61 U/L (ref 38–126)
Anion gap: 9 (ref 5–15)
BUN: 13 mg/dL (ref 8–23)
CO2: 27 mmol/L (ref 22–32)
Calcium: 9.7 mg/dL (ref 8.9–10.3)
Chloride: 104 mmol/L (ref 98–111)
Creatinine, Ser: 1.3 mg/dL — ABNORMAL HIGH (ref 0.61–1.24)
GFR calc Af Amer: >60 mL/min (ref >60)
GFR calc non Af Amer: 59 mL/min — ABNORMAL LOW (ref >60)
Glucose, Bld: 125 mg/dL — ABNORMAL HIGH (ref 70–99)
Potassium: 4.7 mmol/L (ref 3.5–5.1)
Sodium: 140 mmol/L (ref 135–145)
Total Bilirubin: 1.2 mg/dL (ref 0.3–1.2)
Total Protein: 7.2 g/dL (ref 6.5–8.1)

## 2019-11-11 LAB — CBC
HCT: 42.8 % (ref 39.0–52.0)
Hemoglobin: 14.3 g/dL (ref 13.0–17.0)
MCH: 30.4 pg (ref 26.0–34.0)
MCHC: 33.4 g/dL (ref 30.0–36.0)
MCV: 90.9 fL (ref 80.0–100.0)
Platelets: 566 10*3/uL — ABNORMAL HIGH (ref 150–400)
RBC: 4.71 MIL/uL (ref 4.22–5.81)
RDW: 11.8 % (ref 11.5–15.5)
WBC: 15.2 10*3/uL — ABNORMAL HIGH (ref 4.0–10.5)
nRBC: 0 % (ref 0.0–0.2)

## 2019-11-11 LAB — LIPASE, BLOOD: Lipase: 23 U/L (ref 11–51)

## 2019-11-11 LAB — LACTIC ACID, PLASMA: Lactic Acid, Venous: 2.3 mmol/L (ref 0.5–1.9)

## 2019-11-11 MED ORDER — OXYCODONE-ACETAMINOPHEN 5-325 MG PO TABS
1.0000 | ORAL_TABLET | ORAL | Status: DC | PRN
  Administered 2019-11-11: 1 via ORAL
  Filled 2019-11-11: qty 1

## 2019-11-11 NOTE — ED Triage Notes (Signed)
Patient admitted on 10/23/19 and diagnosed with Moderate sigmoid diverticulitis with perforation and gas extending into the abdominal retroperitoneum. Patient back today due to lower abd pain and fever that started today. Reports 101.2 fever at home

## 2019-11-12 ENCOUNTER — Ambulatory Visit: Payer: 59

## 2019-11-12 ENCOUNTER — Ambulatory Visit
Admission: RE | Admit: 2019-11-12 | Discharge: 2019-11-12 | Disposition: A | Discharge status: Home / Self Care | Payer: 59 | Source: Ambulatory Visit | Attending: Surgery | Admitting: Surgery

## 2019-11-12 ENCOUNTER — Other Ambulatory Visit: Payer: Self-pay | Admitting: Surgery

## 2019-11-12 DIAGNOSIS — R1032 Left lower quadrant pain: Secondary | ICD-10-CM

## 2019-11-12 DIAGNOSIS — K5792 Diverticulitis of intestine, part unspecified, without perforation or abscess without bleeding: Secondary | ICD-10-CM

## 2019-11-12 MED ORDER — IOHEXOL 300 MG/ML  SOLN
100.0000 mL | Freq: Once | INTRAMUSCULAR | Status: AC | PRN
  Administered 2019-11-12: 100 mL via INTRAVENOUS

## 2019-11-13 NOTE — Consult Note (Signed)
Subjective:   CC: abd pain, fever  HPI:  Joshua Hayes is a 64 y.o. male who was consulted by Cyril Loosen for issue above.  Symptoms were first noted this am  ago. Pain is sharp, similar to previous diverticular episode, confined to the suprapubic/LLQ area, without radiation.  Associated with nothing specific, exacerbated by palpation.  Still having normal BM, tolerating diet.  Pain lessens with meds.     Past Medical History:  has a past medical history of Perforated diverticulum.  Past Surgical History: none reported  Family History: reviewed and not relevant  Social History:  reports that he has been smoking cigarettes. He has never used smokeless tobacco. He reports current alcohol use of about 2.0 standard drinks of alcohol per week. He reports that he does not use drugs.  Current Medications:  Prior to Admission medications   Medication Sig Start Date End Date Taking? Authorizing Provider  tiZANidine (ZANAFLEX) 4 MG capsule Take by mouth. 09/10/19   [provider]    Allergies:  Allergies as of 11/11/2019  . (No Known Allergies)    ROS:  General: Denies weight loss, weight gain, fatigue, fevers, chills, and night sweats. Eyes: Denies blurry vision, double vision, eye pain, itchy eyes, and tearing. Ears: Denies hearing loss, earache, and ringing in ears. Nose: Denies sinus pain, congestion, infections, runny nose, and nosebleeds. Mouth/throat: Denies hoarseness, sore throat, bleeding gums, and difficulty swallowing. Heart: Denies chest pain, palpitations, racing heart, irregular heartbeat, leg pain or swelling, and decreased activity tolerance. Respiratory: Denies breathing difficulty, shortness of breath, wheezing, cough, and sputum. GI: Denies change in appetite, heartburn, nausea, vomiting, constipation, diarrhea, and blood in stool. GU: Denies difficulty urinating, pain with urinating, urgency, frequency, blood in urine. Musculoskeletal: Denies joint stiffness, pain,  swelling, muscle weakness. Skin: Denies rash, itching, mass, tumors, sores, and boils Neurologic: Denies headache, fainting, dizziness, seizures, numbness, and tingling. Psychiatric: Denies depression, anxiety, difficulty sleeping, and memory loss. Endocrine: Denies heat or cold intolerance, and increased thirst or urination. Blood/lymph: Denies easy bruising, easy bruising, and swollen glands     Objective:     BP (!) 112/97 (BP Location: Left Arm)   Pulse 95   Temp 99.3 F (37.4 C) (Oral)   Resp 16   Ht 5\' 9"  (1.753 m)   Wt 81.9 kg   SpO2 98%   BMI 26.67 kg/m   Constitutional :  alert, cooperative, appears stated age and no distress  Lymphatics/Throat:  no asymmetry, masses, or scars  Respiratory:  clear to auscultation bilaterally  Cardiovascular:  regular rate and rhythm  Gastrointestinal: soft, focal guarding in suprapubic region.   Musculoskeletal: Steady movement  Skin: Cool and moist   Psychiatric: Normal affect, non-agitated, not confused       LABS:  CMP Latest Ref Rng & Units 11/11/2019 10/27/2019 10/26/2019  Glucose 70 - 99 mg/dL 10/28/2019) 474(Q) 595(G)  BUN 8 - 23 mg/dL 13 12 14   Creatinine 0.61 - 1.24 mg/dL 387(F) ) 6.43(P)  Sodium 135 - 145 mmol/L 140 138 139  Potassium 3.5 - 5.1 mmol/L 4.7 3.9 4.0  Chloride 98 - 111 mmol/L 104 104 106  CO2 22 - 32 mmol/L 27 27 25   Calcium 8.9 - 10.3 mg/dL 9.7 9.2 9.0  Total Protein 6.5 - 8.1 g/dL 7.2 - -  Total Bilirubin 0.3 - 1.2 mg/dL 1.2 - -  Alkaline Phos 38 - 126 U/L 61 - -  AST 15 - 41 U/L 20 - -  ALT 0 -  44 U/L 21 - -   CBC Latest Ref Rng & Units 11/11/2019 10/27/2019 10/26/2019  WBC 4.0 - 10.5 K/uL 15.2(H) 9.3 9.7  Hemoglobin 13.0 - 17.0 g/dL 78.5 88.5 02.7  Hematocrit 39 - 52 % 42.8 41.3 40.6  Platelets 150 - 400 K/uL 566(H) 373 372    RADS: Pending CT Assessment:   Abdominal pain and reported fever, likely recurrent diverticulitis  Plan:   CT a/p pending.  Will discuss care moving forward depending  on severity noted on CT.  Patient currently comfortable, tolerating diet and pain with outpt meds so that is reassuring, but may need to consider inpt abx, and/or IR guided drainage if an abscess has formed.  I encouraged patient to wait at least until CT a/p is done.  He verbalized understanding.   Discussed pathophisiology and treatment options including NG tube decompression and possible surgery for lysis of adhesions, laparoscopic or open.  The risk of surgery include, but not limited to, recurrence, bleeding, chronic pain, post-op infxn, post-op SBO or ileus, hernias, resection of bowel, re-anastamosis, possible ostomy placement and need for re-operation to address said risks. The risks of general anesthetic, if used, includes MI, CVA, sudden death or even reaction to anesthetic medications also discussed. Alternatives include continued observation and NG decompression.  Benefits include possible symptom relief, preventing further decline in health and possible death.  Typical post-op recovery time of additional days in hospital for observation afterwards also discussed.  The patient verbalized understanding and all questions were answered to the patient's satisfaction.

## 2019-12-30 ENCOUNTER — Other Ambulatory Visit: Payer: 59

## 2020-01-20 ENCOUNTER — Other Ambulatory Visit: Payer: Self-pay

## 2020-01-20 ENCOUNTER — Other Ambulatory Visit
Admission: RE | Admit: 2020-01-20 | Discharge: 2020-01-20 | Disposition: A | Discharge status: Home / Self Care | Payer: 59 | Source: Ambulatory Visit | Attending: Surgery | Admitting: Surgery

## 2020-01-20 DIAGNOSIS — Z01812 Encounter for preprocedural laboratory examination: Secondary | ICD-10-CM | POA: Insufficient documentation

## 2020-01-20 DIAGNOSIS — Z20822 Contact with and (suspected) exposure to covid-19: Secondary | ICD-10-CM | POA: Diagnosis not present

## 2020-01-21 ENCOUNTER — Encounter: Payer: Self-pay | Admitting: Surgery

## 2020-01-21 LAB — SARS CORONAVIRUS 2 (TAT 6-24 HRS): SARS Coronavirus 2: NEGATIVE

## 2020-01-22 ENCOUNTER — Ambulatory Visit: Payer: 59 | Admitting: Registered Nurse

## 2020-01-22 ENCOUNTER — Encounter: Payer: Self-pay | Admitting: Surgery

## 2020-01-22 ENCOUNTER — Ambulatory Visit
Admission: RE | Admit: 2020-01-22 | Discharge: 2020-01-22 | Disposition: A | Discharge status: Home / Self Care | Payer: 59 | Attending: Surgery | Admitting: Surgery

## 2020-01-22 ENCOUNTER — Encounter
Admission: RE | Disposition: A | Discharge status: Home / Self Care | Payer: Self-pay | Source: Home / Self Care | Attending: Surgery

## 2020-01-22 ENCOUNTER — Other Ambulatory Visit: Payer: Self-pay

## 2020-01-22 DIAGNOSIS — Z885 Allergy status to narcotic agent status: Secondary | ICD-10-CM | POA: Insufficient documentation

## 2020-01-22 DIAGNOSIS — K5732 Diverticulitis of large intestine without perforation or abscess without bleeding: Secondary | ICD-10-CM | POA: Diagnosis present

## 2020-01-22 DIAGNOSIS — K635 Polyp of colon: Secondary | ICD-10-CM | POA: Diagnosis not present

## 2020-01-22 DIAGNOSIS — F17200 Nicotine dependence, unspecified, uncomplicated: Secondary | ICD-10-CM | POA: Insufficient documentation

## 2020-01-22 DIAGNOSIS — K5730 Diverticulosis of large intestine without perforation or abscess without bleeding: Secondary | ICD-10-CM | POA: Diagnosis not present

## 2020-01-22 DIAGNOSIS — K640 First degree hemorrhoids: Secondary | ICD-10-CM | POA: Diagnosis not present

## 2020-01-22 DIAGNOSIS — Z888 Allergy status to other drugs, medicaments and biological substances status: Secondary | ICD-10-CM | POA: Insufficient documentation

## 2020-01-22 HISTORY — PX: COLONOSCOPY WITH PROPOFOL: SHX5780

## 2020-01-22 HISTORY — DX: Diverticulitis of large intestine without perforation or abscess without bleeding: K57.32

## 2020-01-22 SURGERY — COLONOSCOPY WITH PROPOFOL
Anesthesia: General

## 2020-01-22 MED ORDER — SODIUM CHLORIDE 0.9 % IV SOLN
INTRAVENOUS | Status: DC
  Administered 2020-01-22: 1000 mL via INTRAVENOUS

## 2020-01-22 MED ORDER — PROPOFOL 500 MG/50ML IV EMUL
INTRAVENOUS | Status: DC | PRN
  Administered 2020-01-22: 140 ug/kg/min via INTRAVENOUS

## 2020-01-22 MED ORDER — PROPOFOL 10 MG/ML IV BOLUS
INTRAVENOUS | Status: DC | PRN
  Administered 2020-01-22: 20 mg via INTRAVENOUS
  Administered 2020-01-22: 80 mg via INTRAVENOUS
  Administered 2020-01-22: 20 mg via INTRAVENOUS

## 2020-01-22 NOTE — H&P (Signed)
CC: Diverticulitis of large intestine with perforation without abscess or bleeding [K57.20]   HPI: Joshua Hayes is a 64 y.o. male who is here for followup from above. Second round of abx worked well. No further pain episodes. Normal BMs.   Current Medications: has a current medication list which includes the following prescription(s): psyllium (sugar).  Allergies:  Allergies  Allergen Reactions  . Tramadol Dizziness   ROS: General: Denies weight loss, weight gain, fatigue, fevers, chills, and night sweats. Heart: Denies chest pain, palpitations, racing heart, irregular heartbeat, leg pain or swelling, and decreased activity tolerance. Respiratory: Denies breathing difficulty, shortness of breath, wheezing, cough, and sputum. GI: Denies change in appetite, heartburn, nausea, vomiting, constipation, diarrhea, and blood in stool. GU: Denies difficulty urinating, pain with urinating, urgency, frequency, blood in urine   Objective:    BP 137/87  Pulse 82  Ht 175.3 cm (5\' 9" )  Wt 79.4 kg (175 lb)  BMI 25.84 kg/m   Constitutional : alert, appears stated age, cooperative and no distress  Gastrointestinal: soft, non-tender; bowel sounds normal; no masses, no organomegaly.  Musculoskeletal: Steady gait and movement  Skin: Cool and moist, incisions clean, dry, intact. No erythema, induration or drainage to indicate infection.  Psychiatric: Normal affect, non-agitated, not confused    LABS:  N/A   RADS: N/A  Assessment:    Diverticulitis of large intestine with perforation without abscess or bleeding [K57.20]  Plan:    1. Healing well. No issues. Will proceed with scheduling colonoscopy. R/b/a discussed. Risks include bleeding, perforation. Benefits include diagnostic, curative procedure if needed. Alternatives include continued observation. Pt verbalized understanding.

## 2020-01-22 NOTE — Op Note (Signed)
[facility] Patient Name: Castle Lamons Procedure Date: 01/22/2020 7:36 AM MRN: [id number] Account #: [id number] Date of Birth: 1956/02/15 Admit Type: Outpatient Age: 64 Room: [facility] ENDO ROOM 3 Gender: Male Note Status: Finalized Procedure:             Colonoscopy Indications:           Diverticulosis of the colon Providers:             Arville Go MD, MD Referring MD:          No Local Md, MD (Referring MD) Medicines:             Propofol per Anesthesia Complications:         No immediate complications. Procedure:             Pre-Anesthesia Assessment:                        - After reviewing the risks and benefits, the patient                         was deemed in satisfactory condition to undergo the                         procedure in an ambulatory setting.                        After obtaining informed consent, the colonoscope was                         passed under direct vision. Throughout the procedure,                         the patient's blood pressure, pulse, and oxygen                         saturations were monitored continuously. The                         Colonoscope was introduced through the anus and                         advanced to the the cecum, identified by the ileocecal                         valve. The colonoscopy was performed without                         difficulty. The patient tolerated the procedure well.                         The quality of the bowel preparation was good. Findings:      The perianal and digital rectal examinations were normal.      Three sessile polyps were found in the distal sigmoid colon and distal       ascending colon. The polyps were 2 to 4 mm in size. These were biopsied       with a cold jumbo forceps for histology. Estimated blood loss was       minimal.      Non-bleeding internal hemorrhoids were found  during retroflexion. The       hemorrhoids were Grade I (internal hemorrhoids  that do not prolapse).       Estimated blood loss: none.      Many small-mouthed diverticula were found in the sigmoid colon. Impression:            - Three 2 to 4 mm polyps in the distal sigmoid colon                         and in the distal ascending colon. Biopsied.                        - Non-bleeding internal hemorrhoids.                        - Diverticulosis in the sigmoid colon. Recommendation:        - Discharge patient to home.                        - Resume previous diet.                        - Await pathology results.                        - Written discharge instructions were provided to the                         patient. Procedure Code(s):     --- Professional ---                        [phone], Colonoscopy, flexible; with biopsy, single or                         multiple Diagnosis Code(s):     --- Professional ---                        K63.5, Polyp of colon                        K64.0, First degree hemorrhoids                        K57.30, Diverticulosis of large intestine without                         perforation or abscess without bleeding CPT copyright 2019 American Medical Association. All rights reserved. The codes documented in this report are preliminary and upon coder review may  be revised to meet current compliance requirements. Dr. Harrie Foreman, MD Arville Go MD, MD 01/22/2020 8:06:54 AM This report has been signed electronically. Number of Addenda: 0 Note Initiated On: 01/22/2020 7:36 AM Scope Withdrawal Time: 0 hours 13 minutes 26 seconds  Total Procedure Duration: 0 hours 19 minutes 0 seconds  Estimated Blood Loss:  Estimated blood loss: none. Estimated blood loss was                         minimal.      [facility]

## 2020-01-22 NOTE — Progress Notes (Signed)
   01/22/20 0735  Clinical Encounter Type  Visited With Family  Visit Type Initial  Referral From Chaplain  Consult/Referral To Chaplain  While rounding SDS waiting area, chaplain spoke with Pt's wife. Chaplain commented on her reading and she gave chaplain the names of several books to read. Pt's wife also talked about their son, who guts out houses and remakes them. Her son is a Proofreader and is good at what he does. Pt's wife also talked about their grandchildren. It was a pleasant and informative conversation.

## 2020-01-22 NOTE — Transfer of Care (Addendum)
Immediate Anesthesia Transfer of Care Note  Patient: Joshua Hayes  Procedure(s) Performed: COLONOSCOPY WITH PROPOFOL (N/A )  Patient Location: PACU  Anesthesia Type:General  Level of Consciousness: awake, alert  and oriented  Airway & Oxygen Therapy: Patient Spontanous Breathing  Post-op Assessment: Report given to RN and Post -op Vital signs reviewed and stable  Post vital signs: Reviewed and stable  Last Vitals:  Vitals Value Taken Time  BP 110/68 01/22/20 0815  Temp    Pulse 57 01/22/20 0822  Resp 16 01/22/20 0822  SpO2 99 % 01/22/20 0822  Vitals shown include unvalidated device data.  Last Pain:  Vitals:   01/22/20 0810  PainSc: 0-No pain         Complications: No complications documented.

## 2020-01-22 NOTE — Anesthesia Preprocedure Evaluation (Signed)
Anesthesia Evaluation  Patient identified by MRN, date of birth, ID band Patient awake    Reviewed: Allergy & Precautions, NPO status , Patient's Chart, lab work & pertinent test results  Airway Mallampati: II  TM Distance: >3 FB     Dental  (+) Teeth Intact   Pulmonary Current Smoker,    Pulmonary exam normal        Cardiovascular negative cardio ROS Normal cardiovascular exam     Neuro/Psych negative neurological ROS  negative psych ROS   GI/Hepatic negative GI ROS, Neg liver ROS,   Endo/Other  negative endocrine ROS  Renal/GU negative Renal ROS  negative genitourinary   Musculoskeletal negative musculoskeletal ROS (+)   Abdominal Normal abdominal exam  (+)   Peds negative pediatric ROS (+)  Hematology negative hematology ROS (+)   Anesthesia Other Findings Past Medical History: No date: Diverticulitis of large intestine No date: Perforated diverticulum  Reproductive/Obstetrics                             Anesthesia Physical Anesthesia Plan  ASA: II  Anesthesia Plan: General   Post-op Pain Management:    Induction: Intravenous  PONV Risk Score and Plan: Propofol infusion  Airway Management Planned: Nasal Cannula  Additional Equipment:   Intra-op Plan:   Post-operative Plan:   Informed Consent: I have reviewed the patients History and Physical, chart, labs and discussed the procedure including the risks, benefits and alternatives for the proposed anesthesia with the patient or authorized representative who has indicated his/her understanding and acceptance.     Dental advisory given  Plan Discussed with: CRNA and Surgeon  Anesthesia Plan Comments:         Anesthesia Quick Evaluation

## 2020-01-22 NOTE — Interval H&P Note (Signed)
History and Physical Interval Note:  01/22/2020 7:26 AM  Joshua Hayes  has presented today for surgery, with the diagnosis of DIVERTICULITIS.  The various methods of treatment have been discussed with the patient and family. After consideration of risks, benefits and other options for treatment, the patient has consented to  Procedure(s): COLONOSCOPY WITH PROPOFOL (N/A) as a surgical intervention.  The patient's history has been reviewed, patient examined, no change in status, stable for surgery.  I have reviewed the patient's chart and labs.  Questions were answered to the patient's satisfaction.     Alixandra Alfieri Tonna Boehringer

## 2020-01-23 ENCOUNTER — Encounter: Payer: Self-pay | Admitting: Surgery

## 2020-01-23 LAB — SURGICAL PATHOLOGY

## 2020-01-27 NOTE — Anesthesia Postprocedure Evaluation (Signed)
Anesthesia Post Note  Patient: Joshua Hayes  Procedure(s) Performed: COLONOSCOPY WITH PROPOFOL (N/A )  Patient location during evaluation: Endoscopy Anesthesia Type: General Level of consciousness: awake and alert and oriented Pain management: pain level controlled Vital Signs Assessment: post-procedure vital signs reviewed and stable Respiratory status: spontaneous breathing Cardiovascular status: blood pressure returned to baseline Anesthetic complications: no   No complications documented.   Last Vitals:  Vitals:   01/22/20 0845 01/22/20 0846  BP: (!) 141/77 (!) 141/77  Pulse:  (!) 57  Resp:  11  Temp:    SpO2:  98%    Last Pain:  Vitals:   01/23/20 0732  PainSc: 0-No pain                 Kada Friesen

## 2021-04-14 ENCOUNTER — Ambulatory Visit
Admission: RE | Admit: 2021-04-14 | Discharge: 2021-04-14 | Disposition: A | Discharge status: Home / Self Care | Payer: 59 | Source: Ambulatory Visit

## 2021-04-14 ENCOUNTER — Other Ambulatory Visit: Payer: Self-pay

## 2021-04-14 VITALS — BP 145/92 | HR 89 | Temp 98.2°F | Resp 18

## 2021-04-14 DIAGNOSIS — R42 Dizziness and giddiness: Secondary | ICD-10-CM | POA: Diagnosis not present

## 2021-04-14 MED ORDER — MECLIZINE HCL 25 MG PO TABS: 25.0000 mg | ORAL_TABLET | Freq: Three times a day (TID) | ORAL | 0 refills | Status: AC | PRN

## 2021-04-14 NOTE — Discharge Instructions (Signed)
I am referring you to [facility] for evaluation of possible inner ear issues causing your dizziness.  I would recommend also having an eye exam to rule out your blurry vision as a mere need for a new prescription.  Use the Antivert every 8 hours as needed for dizziness.   If you develop a sudden increase in dizziness, sharp headache, changes in vision, nausea or vomiting you need to go to the ER for evaluation.

## 2021-04-14 NOTE — ED Triage Notes (Signed)
Pt here with C/O dizziness off and on for 1.5 week. Was taking dramamine and it did help. Feels off. Denies chest pain or any other SX>

## 2021-04-14 NOTE — ED Provider Notes (Signed)
[facility]    CSN: [id number] Arrival date & time: 04/14/21  1047      History   Chief Complaint Chief Complaint  Patient presents with   Dizziness    11am APPOINTMENT    HPI Joshua Hayes is a 66 y.o. male.   HPI  57 old male here for evaluation of dizziness.  Patient reports that he has been experiencing dizziness off and on for the last week and a half.  The episodes typically happen when he is driving or in motion.  He states he had several episodes happen while he was driving a vehicle and 1 while he was transitioning off of an escalator.  He has been using over-the-counter Dramamine which has been helping.  He is also try doing the Epley maneuver at home which provide some relief as well.  He states is more a feeling of being off balance and he denies any room spinning.  Patient also denies fever, runny nose nasal congestion, nausea or vomiting, or ringing in his ear.  He states that when the symptoms started he felt like he had water in his left ear.  He also endorses a headache which is mostly in the occiput.  He is also reporting blurry vision but he states he thinks is because he needs new glasses.  He did have sweating with 1 episode of dizziness.  He has not taken any Dramamine since yesterday and he is not having any dizziness at present.  Past Medical History:  Diagnosis Date   Diverticulitis of large intestine    Perforated diverticulum     Patient Active Problem List   Diagnosis Date Noted   Diverticulitis of large intestine with perforation without bleeding    Diverticulitis 10/23/2019    Past Surgical History:  Procedure Laterality Date   COLONOSCOPY WITH PROPOFOL N/A 01/22/2020   Procedure: COLONOSCOPY WITH PROPOFOL;  Surgeon: Sung Amabile, DO;  Location: [facility] ENDOSCOPY;  Service: General;  Laterality: N/A;       Home Medications    Prior to Admission medications   Medication Sig Start Date End Date Taking? Authorizing Provider   meclizine (ANTIVERT) 25 MG tablet Take 1 tablet (25 mg total) by mouth 3 (three) times daily as needed for dizziness. 04/14/21  Yes Becky Augusta, NP  psyllium (KONSYL) 33 % POWD Take by mouth.   Yes [provider]    Family History History reviewed. No pertinent family history.  Social History Social History   Tobacco Use   Smoking status: Every Day    Packs/day: 0.50    Types: Cigarettes   Smokeless tobacco: Never  Vaping Use   Vaping Use: Never used  Substance Use Topics   Alcohol use: Yes    Alcohol/week: 2.0 standard drinks    Types: 2 Cans of beer per week   Drug use: Never     Allergies   Tramadol hcl   Review of Systems Review of Systems  Constitutional:  Negative for activity change, appetite change and fever.  HENT:  Negative for congestion, ear discharge, ear pain, hearing loss, rhinorrhea and tinnitus.   Gastrointestinal:  Negative for nausea and vomiting.  Skin:  Negative for rash.  Neurological:  Positive for dizziness and headaches.  Hematological: Negative.   Psychiatric/Behavioral: Negative.      Physical Exam Triage Vital Signs ED Triage Vitals  Enc Vitals Group     BP 04/14/21 1109 (!) 145/92     Pulse Rate 04/14/21 1109 89  Resp 04/14/21 1109 18     Temp 04/14/21 1109 98.2 F (36.8 C)     Temp Source 04/14/21 1109 Oral     SpO2 04/14/21 1109 97 %     Weight --      Height --      Head Circumference --      Peak Flow --      Pain Score 04/14/21 1106 0     Pain Loc --      Pain Edu? --      Excl. in [place]? --    No data found.  Updated Vital Signs BP (!) 145/92 (BP Location: Left Arm)    Pulse 89    Temp 98.2 F (36.8 C) (Oral)    Resp 18    SpO2 97%   Visual Acuity Right Eye Distance:   Left Eye Distance:   Bilateral Distance:    Right Eye Near:   Left Eye Near:    Bilateral Near:     Physical Exam Vitals and nursing note reviewed.  Constitutional:      General: He is not in acute distress.    Appearance:  Normal appearance. He is normal weight. He is not ill-appearing.  HENT:     Head: Normocephalic and atraumatic.     Right Ear: Tympanic membrane, ear canal and external ear normal. There is no impacted cerumen.     Left Ear: Tympanic membrane, ear canal and external ear normal. There is no impacted cerumen.     Nose: Nose normal. No congestion or rhinorrhea.     Mouth/Throat:     Pharynx: Oropharynx is clear. No posterior oropharyngeal erythema.  Eyes:     General: No scleral icterus.    Extraocular Movements: Extraocular movements intact.     Pupils: Pupils are equal, round, and reactive to light.  Cardiovascular:     Rate and Rhythm: Normal rate and regular rhythm.     Pulses: Normal pulses.     Heart sounds: Normal heart sounds. No murmur heard.   No friction rub. No gallop.  Pulmonary:     Effort: Pulmonary effort is normal.     Breath sounds: Normal breath sounds. No wheezing, rhonchi or rales.  Skin:    General: Skin is warm and dry.     Capillary Refill: Capillary refill takes less than 2 seconds.     Findings: No erythema or rash.  Neurological:     General: No focal deficit present.     Mental Status: He is alert and oriented to person, place, and time.     Cranial Nerves: No cranial nerve deficit.     Sensory: No sensory deficit.     Motor: No weakness.     Coordination: Coordination normal.     Gait: Gait normal.     Deep Tendon Reflexes: Reflexes normal.  Psychiatric:        Mood and Affect: Mood normal.        Behavior: Behavior normal.        Thought Content: Thought content normal.        Judgment: Judgment normal.     UC Treatments / Results  Labs (all labs ordered are listed, but only abnormal results are displayed) Labs Reviewed - No data to display  EKG   Radiology No results found.  Procedures Procedures (including critical care time)  Medications Ordered in UC Medications - No data to display  Initial Impression / Assessment and Plan / UC  Course  I have reviewed the triage vital signs and the nursing notes.  Pertinent labs & imaging results that were available during my care of the patient were reviewed by me and considered in my medical decision making (see chart for details).  Patient is a very pleasant, nontoxic-appearing 66 year old male here for evaluation of intermittent dizziness as it present for last week and a half and is typically associated with being in motion.  He denies any triggering of symptoms with head movement or change of position.  He has been using over-the-counter Dramamine which has been helping his symptoms.  He is also try doing the Epley maneuver at home which she was showed by 2 friends of his that are occupational and physical therapist respectively.  He states that when he does the Epley maneuver he has some improvement of symptoms.  Is more of a feeling of being off balance and not room spinning.  It is not associate with nausea or vomiting.  He has had blurry vision but he attributes that to needing new glasses.  He also has had a headache that is been in the occipital area.  He attributes that to bone spurs in his neck and constant neck pain.  His physical exam revealed cranial nerves II through XII intact.  Pupils reground reactive and EOMs intact.  Patient's symptoms were triggered after the completion of EOM when returning vision to center.  The symptoms lasted a fraction of a second and resolved.  Patient's upper and lower extremity strength is 5/5, bilateral grips are 5/5, DTRs 2+ globally.  Cardiopulmonary exam reveals S1-S2 heart sounds without murmur, rub, or gallop.  Lung sounds are clear to auscultation all fields.  The source of the patient's symptoms is unclear though I suspect there might be inner ear involvement.  Even though I did not see an effusion I Minna prescribe the patient Antivert and refer him to ENT for full evaluation.  If his ENT evaluation is negative discussed with the patient that  he would need to follow-up with neurology.  I have also advised him to make an appointment to see his eye doctor and have his vision checked to ensure that the blurry vision he is experiencing is not more than him needing a new prescription.  ER precautions reviewed with patient.   Final Clinical Impressions(s) / UC Diagnoses   Final diagnoses:  Dizziness     Discharge Instructions      I am referring you to [facility] for evaluation of possible inner ear issues causing your dizziness.  I would recommend also having an eye exam to rule out your blurry vision as a mere need for a new prescription.  Use the Antivert every 8 hours as needed for dizziness.   If you develop a sudden increase in dizziness, sharp headache, changes in vision, nausea or vomiting you need to go to the ER for evaluation.     ED Prescriptions     Medication Sig Dispense Auth. Provider   meclizine (ANTIVERT) 25 MG tablet Take 1 tablet (25 mg total) by mouth 3 (three) times daily as needed for dizziness. 30 tablet Margarette Canada, NP      PDMP not reviewed this encounter.   Margarette Canada, NP 04/14/21 1228

## 2021-06-27 ENCOUNTER — Emergency Department
Admission: EM | Admit: 2021-06-27 | Discharge: 2021-06-27 | Disposition: A | Discharge status: Home / Self Care | Payer: 59 | Attending: Emergency Medicine | Admitting: Emergency Medicine

## 2021-06-27 ENCOUNTER — Other Ambulatory Visit: Payer: Self-pay

## 2021-06-27 ENCOUNTER — Ambulatory Visit
Admission: EM | Admit: 2021-06-27 | Discharge: 2021-06-27 | Payer: 59 | Attending: Emergency Medicine | Admitting: Emergency Medicine

## 2021-06-27 ENCOUNTER — Encounter: Payer: Self-pay | Admitting: Emergency Medicine

## 2021-06-27 ENCOUNTER — Emergency Department: Payer: 59

## 2021-06-27 DIAGNOSIS — R1032 Left lower quadrant pain: Secondary | ICD-10-CM

## 2021-06-27 DIAGNOSIS — K5732 Diverticulitis of large intestine without perforation or abscess without bleeding: Secondary | ICD-10-CM | POA: Diagnosis not present

## 2021-06-27 LAB — COMPREHENSIVE METABOLIC PANEL
ALT: 48 U/L — ABNORMAL HIGH (ref 0–44)
AST: 39 U/L (ref 15–41)
Albumin: 4.5 g/dL (ref 3.5–5.0)
Alkaline Phosphatase: 72 U/L (ref 38–126)
Anion gap: 7 (ref 5–15)
BUN: 14 mg/dL (ref 8–23)
CO2: 27 mmol/L (ref 22–32)
Calcium: 9.3 mg/dL (ref 8.9–10.3)
Chloride: 103 mmol/L (ref 98–111)
Creatinine, Ser: 1.14 mg/dL (ref 0.61–1.24)
GFR, Estimated: >60 mL/min (ref >60)
Glucose, Bld: 109 mg/dL — ABNORMAL HIGH (ref 70–99)
Potassium: 3.9 mmol/L (ref 3.5–5.1)
Sodium: 137 mmol/L (ref 135–145)
Total Bilirubin: 2.1 mg/dL — ABNORMAL HIGH (ref 0.3–1.2)
Total Protein: 7.8 g/dL (ref 6.5–8.1)

## 2021-06-27 LAB — CBC
HCT: 44.9 % (ref 39.0–52.0)
Hemoglobin: 15.2 g/dL (ref 13.0–17.0)
MCH: 30.6 pg (ref 26.0–34.0)
MCHC: 33.9 g/dL (ref 30.0–36.0)
MCV: 90.3 fL (ref 80.0–100.0)
Platelets: 387 10*3/uL (ref 150–400)
RBC: 4.97 MIL/uL (ref 4.22–5.81)
RDW: 12.3 % (ref 11.5–15.5)
WBC: 13.5 10*3/uL — ABNORMAL HIGH (ref 4.0–10.5)
nRBC: 0 % (ref 0.0–0.2)

## 2021-06-27 LAB — LACTIC ACID, PLASMA: Lactic Acid, Venous: 1.2 mmol/L (ref 0.5–1.9)

## 2021-06-27 LAB — LIPASE, BLOOD: Lipase: 24 U/L (ref 11–51)

## 2021-06-27 MED ORDER — IOHEXOL 300 MG/ML  SOLN
100.0000 mL | Freq: Once | INTRAMUSCULAR | Status: AC | PRN
  Administered 2021-06-27: 100 mL via INTRAVENOUS

## 2021-06-27 MED ORDER — PIPERACILLIN-TAZOBACTAM 3.375 G IVPB 30 MIN
3.3750 g | Freq: Once | INTRAVENOUS | Status: AC
  Administered 2021-06-27: 3.375 g via INTRAVENOUS
  Filled 2021-06-27: qty 50

## 2021-06-27 MED ORDER — KETOROLAC TROMETHAMINE 30 MG/ML IJ SOLN
30.0000 mg | Freq: Once | INTRAMUSCULAR | Status: AC
  Administered 2021-06-27: 30 mg via INTRAVENOUS
  Filled 2021-06-27: qty 1

## 2021-06-27 MED ORDER — FENTANYL CITRATE PF 50 MCG/ML IJ SOSY
50.0000 ug | PREFILLED_SYRINGE | INTRAMUSCULAR | Status: DC | PRN
  Administered 2021-06-27: 50 ug via INTRAVENOUS
  Filled 2021-06-27: qty 1

## 2021-06-27 MED ORDER — METRONIDAZOLE 500 MG PO TABS: 500.0000 mg | ORAL_TABLET | Freq: Three times a day (TID) | ORAL | 0 refills | Status: AC

## 2021-06-27 MED ORDER — HYDROCODONE-ACETAMINOPHEN 5-325 MG PO TABS: 1.0000 | ORAL_TABLET | ORAL | 0 refills | Status: AC | PRN

## 2021-06-27 MED ORDER — CIPROFLOXACIN HCL 500 MG PO TABS: 500.0000 mg | ORAL_TABLET | Freq: Two times a day (BID) | ORAL | 0 refills | Status: AC

## 2021-06-27 MED ORDER — SODIUM CHLORIDE 0.9 % IV BOLUS
1000.0000 mL | Freq: Once | INTRAVENOUS | Status: AC
  Administered 2021-06-27: 1000 mL via INTRAVENOUS

## 2021-06-27 NOTE — ED Provider Notes (Signed)
? ?[facility] ?Provider Note ? ? Event Date/Time  ? First MD Initiated Contact with Patient 06/27/21 [phone]   ?  (approximate) ?History  ?Abdominal Pain ? ?HPI ?Joshua Hayes is a 66 y.o. male with a stated past medical history of diverticulosis who presents for left lower quadrant abdominal pain that began 4 days prior to arrival and has been worsening since onset.  Patient describes an 8/10, sharp, burning, nonradiating left lower quadrant abdominal pain that is somewhat worsened with eating.  Patient states that this pain is similar to when he had perforated diverticulitis in the past however it has been more severe.  Patient has tried using 600 mg of Advil and a gram of Tylenol with improvement in his fever but denies any improvement in his abdominal pain.  Patient states that he has close follow-up with Dr. Tonna Boehringer if needed. ?Physical Exam  ?Triage Vital Signs: ?ED Triage Vitals  ?Enc Vitals Group  ?   BP 06/27/21 0919 (!) 144/76  ?   Pulse Rate 06/27/21 0919 81  ?   Resp 06/27/21 0919 18  ?   Temp 06/27/21 0919 98.3 ?F (36.8 ?C)  ?   Temp Source 06/27/21 0919 Oral  ?   SpO2 06/27/21 0919 98 %  ?   Weight --   ?   Height --   ?   Head Circumference --   ?   Peak Flow --   ?   Pain Score 06/27/21 0920 8  ?   Pain Loc --   ?   Pain Edu? --   ?   Excl. in [place]? --   ? ?Most recent vital signs: ?Vitals:  ? 06/27/21 1021 06/27/21 1022  ?BP: 139/79   ?Pulse: 79 79  ?Resp: 18   ?Temp:    ?SpO2: 97% 97%  ? ?General: Awake, oriented x4. ?CV:  Good peripheral perfusion.  ?Resp:  Normal effort.  ?Abd:  No distention.  Significant left lower quadrant tenderness to palpation ?Other:  Middle-aged Caucasian male laying in bed in no distress ?ED Results / Procedures / Treatments  ?Labs ?(all labs ordered are listed, but only abnormal results are displayed) ?Labs Reviewed  ?COMPREHENSIVE METABOLIC PANEL - Abnormal; Notable for the following components:  ?    Result Value  ? Glucose, Bld 109 (*)   ? ALT 48 (*)    ? Total Bilirubin 2.1 (*)   ? All other components within normal limits  ?CBC - Abnormal; Notable for the following components:  ? WBC 13.5 (*)   ? All other components within normal limits  ?CULTURE, BLOOD (ROUTINE X 2)  ?CULTURE, BLOOD (ROUTINE X 2)  ?LIPASE, BLOOD  ?LACTIC ACID, PLASMA  ?URINALYSIS, ROUTINE W REFLEX MICROSCOPIC  ?LACTIC ACID, PLASMA  ? ?RADIOLOGY ?ED MD interpretation: CT of the abdomen and pelvis with IV contrast shows acute diverticulitis of the sigmoid colon without evidence of perforation ?-Agree with radiology assessment ?Official radiology report(s): ?CT Abdomen Pelvis W Contrast ? ?Result Date: 06/27/2021 ?CLINICAL DATA:  Left lower quadrant abdominal pain beginning 3 days ago. Fever. History of diverticulitis. EXAM: CT ABDOMEN AND PELVIS WITH CONTRAST TECHNIQUE: Multidetector CT imaging of the abdomen and pelvis was performed using the standard protocol following bolus administration of intravenous contrast. RADIATION DOSE REDUCTION: This exam was performed according to the departmental dose-optimization program which includes automated exposure control, adjustment of the mA and/or kV according to patient size and/or use of iterative reconstruction technique. CONTRAST:  OMNIPAQUE  IOHEXOL 300 MG/ML  SOLN COMPARISON:  11/12/2019 FINDINGS: Lower chest: Normal except for a linear scar at the right lung base. Hepatobiliary: Normal Pancreas: Normal Spleen: Normal Adrenals/Urinary Tract: Adrenal glands are normal. Numerous simple appearing renal cysts. No stone or hydronephrosis. The bladder is intrinsically normal, but does extend partially into a right inguinal hernia. Stomach/Bowel: Stomach and small bowel are normal. Normal appendix. There is diverticulosis of the left colon with acute diverticulitis in the sigmoid region. There is a 7 8 cm segment of bowel wall thickening with surrounding inflammatory edema. No evidence of abscess, free fluid or free air. Vascular/Lymphatic: Aortic  atherosclerosis. No aneurysm. IVC is normal. No adenopathy. Reproductive: Normal Other: No other significant finding. Musculoskeletal: Ordinary mild lower lumbar degenerative changes. IMPRESSION: Acute diverticulitis of the sigmoid colon. Surrounding inflammatory edema but without evidence of abscess, free fluid or free air at this time. Renal cysts as seen previously. Right inguinal hernia containing fat and an extension of the urinary bladder. Aortic Atherosclerosis (ICD10-I70.0). Electronically Signed   By: Paulina Fusi M.D.   On: 06/27/2021 10:50   ?PROCEDURES: ?Critical Care performed: No ?Procedures ?MEDICATIONS ORDERED IN ED: ?Medications  ?fentaNYL (SUBLIMAZE) injection 50 mcg (50 mcg Intravenous Given 06/27/21 1016)  ?piperacillin-tazobactam (ZOSYN) IVPB 3.375 g (3.375 g Intravenous New Bag/Given 06/27/21 1110)  ?sodium chloride 0.9 % bolus 1,000 mL (1,000 mLs Intravenous New Bag/Given 06/27/21 1017)  ?ketorolac (TORADOL) 30 MG/ML injection 30 mg (30 mg Intravenous Given 06/27/21 1017)  ?iohexol (OMNIPAQUE) 300 MG/ML solution 100 mL (100 mLs Intravenous Contrast Given 06/27/21 1033)  ? ?IMPRESSION / MDM / ASSESSMENT AND PLAN / ED COURSE  ?I reviewed the triage vital signs and the nursing notes. ?             ?               ?The patient is on the cardiac monitor to evaluate for evidence of arrhythmia and/or significant heart rate changes. ? ?Patient has diverticulitis that is amenable to oral antibiotics. ?Patient has no peritoneal signs or signs of perforation. Patient?s symptoms not typical for other emergent causes of abdominal pain such as, but not limited to, appendicitis, abdominal aortic aneurysm, surgical biliary disease, acute coronary syndrome, etc. patient has signs of mild leukocytosis at 13.5.  Patient's has no other than some lactic acidosis.  CMP within normal limits.  Considered admission for this patient however given no perforation and patient feels comfortable with outpatient resources, will  discharge ? ?Patient will be discharged with strict return precautions and follow up with primary MD within 12-24 hours for further evaluation.  Patient understands that they may require admission and IV antibiotics and possibly surgery if they do not improve with oral antibiotics. ? ?  ?FINAL CLINICAL IMPRESSION(S) / ED DIAGNOSES  ? ?Final diagnoses:  ?Diverticulitis large intestine w/o perforation or abscess w/o bleeding  ? ?Rx / DC Orders  ? ?ED Discharge Orders   ? ? None  ? ?  ? ?Note:  This document was prepared using Dragon voice recognition software and may include unintentional dictation errors. ?  ?Merwyn Katos, MD ?06/27/21 1126 ? ?

## 2021-06-27 NOTE — ED Notes (Signed)
Patient is being discharged from the Urgent Care and sent to the Emergency Department via POV . Per Alphonzo Cruise, MD, patient is in need of higher level of care due to requiring CT and severe abdominal pain. Patient is aware and verbalizes understanding of plan of care.  ?Vitals:  ? 06/27/21 0824  ?BP: 131/82  ?Pulse: 82  ?Resp: 16  ?Temp: 99 ?F (37.2 ?C)  ?SpO2: 97%  ?  ?

## 2021-06-27 NOTE — ED Notes (Signed)
MD Bradler informed of collection of 1.5 sets of cultures. MD stated to send cultures and start abx since one full set was obtained. One full set and One blue top culture bottle sent to lab at this time.  ?

## 2021-06-27 NOTE — ED Notes (Signed)
Pt informed of need for urine sample and given urinal at this time.  ?

## 2021-06-27 NOTE — ED Triage Notes (Signed)
Pt to Ed via POV with c/o abd pain that started Friday. He has had a fever yesterday today he took tylenol before coming. It was 99.2 He has a hx of perforated diverticulitis and this feels worse. It it hurting in his LLQ.  ?

## 2021-06-27 NOTE — ED Triage Notes (Signed)
Patient presents to Urgent Care with complaints of severe abdominal pain since Friday evening. Fever last night. Had a coloscopy in oct 2022. He states he has a hx of perforated diverticulitis. Treating pain and fever with tylenol last dose last night.  ? ?Denies n/v or diarrhea.  ?

## 2021-06-27 NOTE — Discharge Instructions (Addendum)
Please go immediately to the [facility] ED.  You need labs and imaging that are not available here today.  Let them know if your pain changes, gets worse, or for any concerns. ?

## 2021-06-27 NOTE — ED Provider Notes (Signed)
HPI ? ?SUBJECTIVE: ? ?Joshua Hayes is a 66 y.o. male who presents with 4 days of worsening left lower quadrant pain that waxes and wanes.  He describes it as sharp, occasionally migrating to the suprapubic region.  He states that it is identical to, but more severe than, his previous episode of perforated diverticulitis.  He reports fevers Tmax 100.8 starting last night.  No nausea, vomiting, abdominal distention, anorexia, melena, hematochezia.  He states that the car ride over here was not painful.  He had a small bowel movement yesterday.  No antipyretic in the past 6 hours.  He has been alternating 600 mg of Advil with 1000 mg of Tylenol with improvement in his fever, but no effect on his abdominal pain.  No aggravating factors.  He has a past medical history of perforated diverticulitis that was observed in the hospital for 4 days.  He did not require surgery.  He also has a history of vertigo.  He has never had any abdominal surgeries.  Surgery: Dr. Tonna Boehringer ? ? ?Past Medical History:  ?Diagnosis Date  ? Diverticulitis of large intestine   ? Perforated diverticulum   ? ? ?Past Surgical History:  ?Procedure Laterality Date  ? COLONOSCOPY WITH PROPOFOL N/A 01/22/2020  ? Procedure: COLONOSCOPY WITH PROPOFOL;  Surgeon: Sung Amabile, DO;  Location: [facility] ENDOSCOPY;  Service: General;  Laterality: N/A;  ? ? ?History reviewed. No pertinent family history. ? ?Social History  ? ?Tobacco Use  ? Smoking status: Every Day  ?  Packs/day: 0.50  ?  Types: Cigarettes  ? Smokeless tobacco: Never  ?Vaping Use  ? Vaping Use: Never used  ?Substance Use Topics  ? Alcohol use: Yes  ?  Alcohol/week: 2.0 standard drinks  ?  Types: 2 Cans of beer per week  ? Drug use: Never  ? ? ?No current facility-administered medications for this encounter. ? ?Current Outpatient Medications:  ?  meclizine (ANTIVERT) 25 MG tablet, Take 1 tablet (25 mg total) by mouth 3 (three) times daily as needed for dizziness., Disp: 30 tablet, Rfl: 0 ?  psyllium  (KONSYL) 33 % POWD, Take by mouth., Disp: , Rfl:  ? ?Allergies  ?Allergen Reactions  ? Tramadol Nausea And Vomiting  ? Tramadol Hcl Other (See Comments)  ?  dizziness  ? ? ? ?ROS ? ?As noted in HPI.  ? ?Physical Exam ? ?BP 131/82 (BP Location: Left Arm)   Pulse 82   Temp 99 ?F (37.2 ?C) (Oral)   Resp 16   Wt 86.2 kg   SpO2 97%   BMI 28.06 kg/m?  ? ?Constitutional: Well developed, well nourished, no acute distress ?Eyes:  EOMI, conjunctiva normal bilaterally ?HENT: Normocephalic, atraumatic,mucus membranes moist ?Respiratory: Normal inspiratory effort ?Cardiovascular: Normal rate ?GI: nondistended soft.  Positive lower abdominal tenderness, maximal in the left lower quadrant.  Positive rebound and voluntary guarding.  No distention.  Active bowel sounds. ?Back: No CVAT ?skin: No rash, skin intact ?Musculoskeletal: no deformities ?Neurologic: Alert & oriented x 3, no focal neuro deficits ?Psychiatric: Speech and behavior appropriate ? ? ?ED Course ? ? ?Medications - No data to display ? ?No orders of the defined types were placed in this encounter. ? ? ?No results found for this or any previous visit (from the past 24 hour(s)). ?No results found. ? ?ED Clinical Impression ? ?1. Left lower quadrant abdominal pain   ?  ? ?ED Assessment/Plan ? ?Concern for recurrent diverticular perforation.  He has peritoneal signs and fever at  home, although he is afebrile here.  Discussed with patient that he needs labs, and a CT.  CT is not available at this facility today.  He is stable to go to the ED via private vehicle.  He states that he will go to [facility]. ? ?Messaged  Dr. Tonna Boehringer, his surgeon, through epic and let him know that patient is heading to the ED. ? ?Discussed rationale for transfer to the emergency department with the patient.  He and his wife agree with plan. ? ?No orders of the defined types were placed in this encounter. ? ? ? ? ?*This clinic note was created using Scientist, clinical (histocompatibility and immunogenetics). Therefore, there may  be occasional mistakes despite careful proofreading. ? ?? ? ?  ?Domenick Gong, MD ?06/27/21 [phone] ? ?

## 2021-07-02 LAB — CULTURE, BLOOD (ROUTINE X 2)
Culture: NO GROWTH
Culture: NO GROWTH

## 2022-05-25 ENCOUNTER — Ambulatory Visit: Payer: Self-pay | Admitting: Surgery

## 2022-05-25 NOTE — H&P (Signed)
Subjective:   CC: No primary diagnosis found.  HPI:  Joshua Hayes is a 67 y.o. male who presents for evaluation of above. Right hernia noted previously starting to become symptomatic.  Requesting repair   Past Medical History:  has a past medical history of Diverticulitis (11/2019).  Past Surgical History:  Past Surgical History:  Procedure Laterality Date   ARTHROSCOPY SHOULDER Left 2001   Dr Earnestine Leys    Family History: family history includes Coronary Artery Disease (Blocked arteries around heart) in his father; Diabetes in his father; High blood pressure (Hypertension) in his father; No Known Problems in his brother; Rheum arthritis in his mother; Stroke in his father.  Social History:  reports that he has been smoking. He has never used smokeless tobacco. He reports current alcohol use. No history on file for drug use.  Current Medications: has a current medication list which includes the following prescription(s): psyllium (sugar).  Allergies:  Allergies as of 05/25/2022 - Reviewed 11/24/2019  Allergen Reaction Noted   Tramadol Dizziness 11/24/2019    ROS:  A 15 point review of systems was performed and pertinent positives and negatives noted in HPI   Objective:     There were no vitals taken for this visit.  Constitutional :  Alert, cooperative, no distress  Lymphatics/Throat:  Supple, no lymphadenopathy  Respiratory:  clear to auscultation bilaterally  Cardiovascular:  regular rate and rhythm  Gastrointestinal: soft, non-tender; bowel sounds normal; no masses,  no organomegaly. inguinal hernia noted.  small, right  Musculoskeletal: Steady gait and movement  Skin: Cool and moist  Psychiatric: Normal affect, non-agitated, not confused       LABS:  N/a   RADS: N/a Assessment:       No primary diagnosis found.  Plan:     1. No primary diagnosis found.   Discussed the risk of surgery including recurrence, which can be up to 50% in the case of  incisional or complex hernias, possible use of prosthetic materials (mesh) and the increased risk of mesh infxn if used, bleeding, chronic pain, post-op infxn, post-op SBO or ileus, and possible re-operation to address said risks. The risks of general anesthetic, if used, includes MI, CVA, sudden death or even reaction to anesthetic medications also discussed. Alternatives include continued observation.  Benefits include possible symptom relief, prevention of incarceration, strangulation, enlargement in size over time, and the risk of emergency surgery in the face of strangulation.   Typical post-op recovery time of 3-5 days with 2 weeks of activity restrictions were also discussed.  ED return precautions given for sudden increase in pain, size of hernia with accompanying fever, nausea, and/or vomiting.  The patient verbalized understanding and all questions were answered to the patient's satisfaction.   2. Patient has elected to proceed with surgical treatment. Procedure will be scheduled. right, robotic assisted laparoscopic  labs/images/medications/previous chart entries reviewed personally and relevant changes/updates noted above.

## 2023-08-15 ENCOUNTER — Ambulatory Visit: Admitting: Physician Assistant

## 2023-09-05 ENCOUNTER — Ambulatory Visit: Admitting: Physician Assistant

## 2023-09-26 ENCOUNTER — Ambulatory Visit: Admitting: Physician Assistant

## 2023-11-16 IMAGING — CT CT ABD-PELV W/ CM
2 of 5 series · 16 of 46 positions shown, 18 images · IV contrast (APPLIED)
Comparison: 11/12/2019

CLINICAL DATA: Left lower quadrant abdominal pain beginning 3 days
ago. Fever. History of diverticulitis.

EXAM:
CT ABDOMEN AND PELVIS WITH CONTRAST
TECHNIQUE: Multidetector CT imaging of the abdomen and pelvis was performed
using the standard protocol following bolus administration of
intravenous contrast.

[Series 2: abdomen 5.0 · axial · 0.78mm/px · z∈[+845,+1220]mm · 13 of 87 slices shown, 15 images]
[im 6/87  soft-tissue]
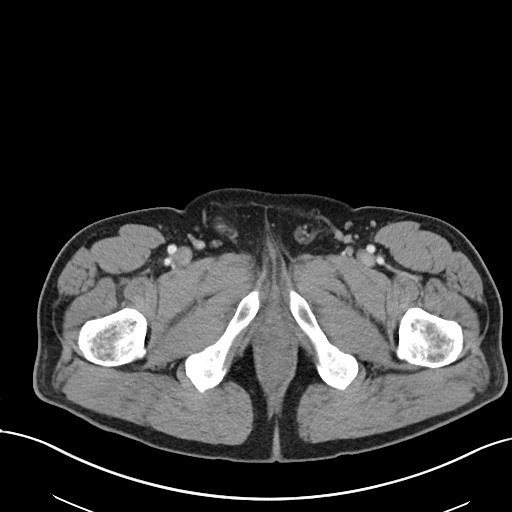
[im 6/87  bone]
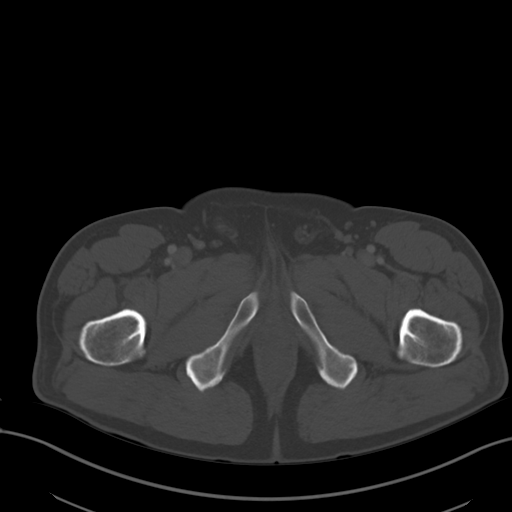
[im 11/87  soft-tissue]
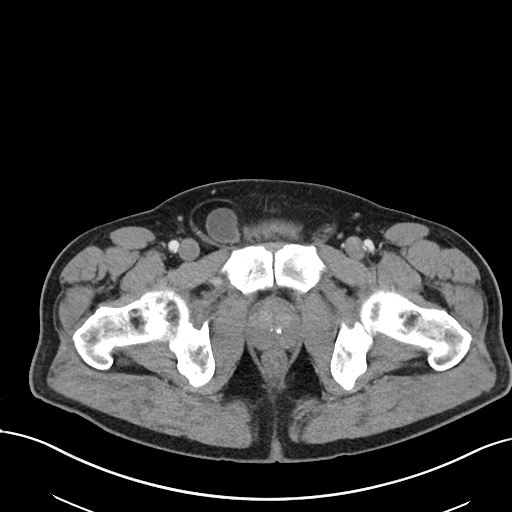
[im 17/87  soft-tissue]
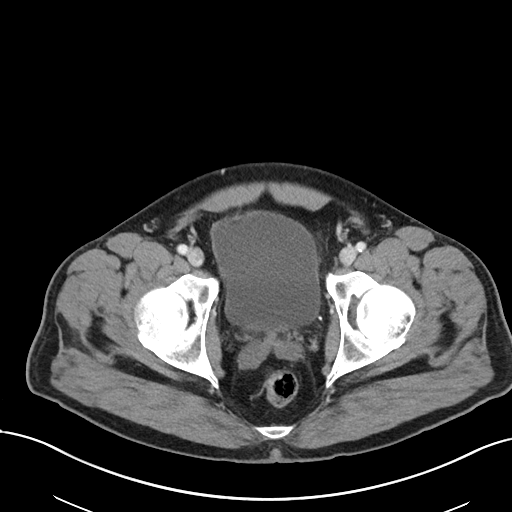
[im 27/87  soft-tissue]
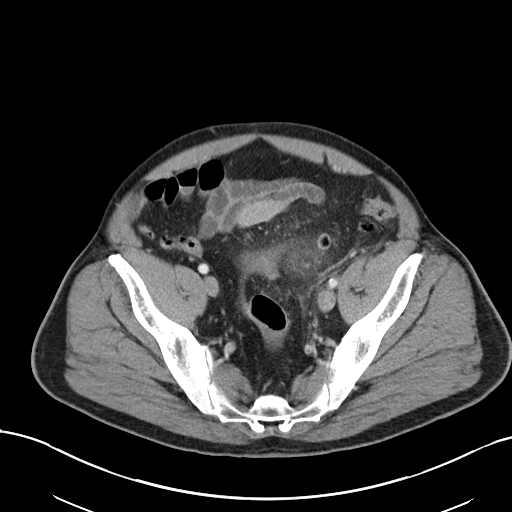
[im 33/87  soft-tissue]
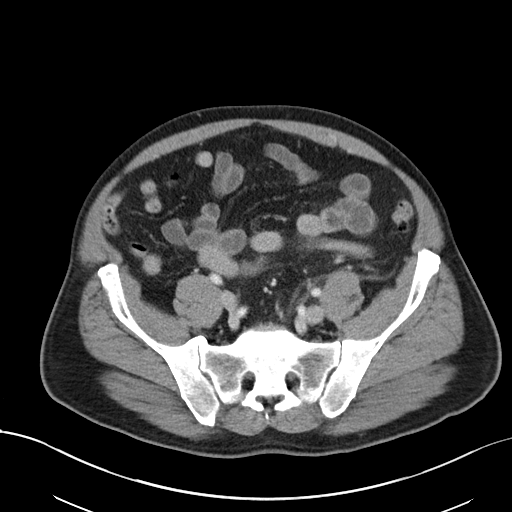
[im 38/87  soft-tissue]
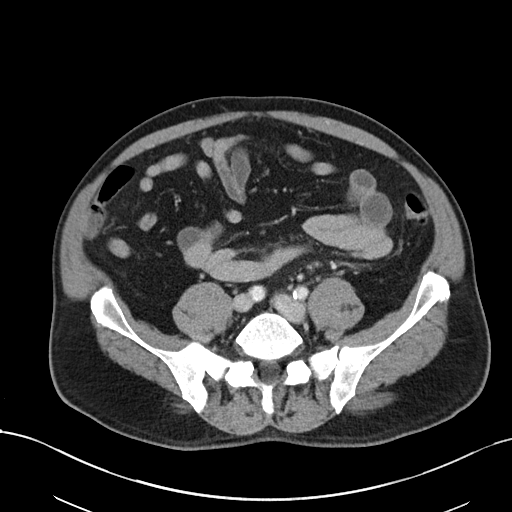
[im 44/87  soft-tissue]
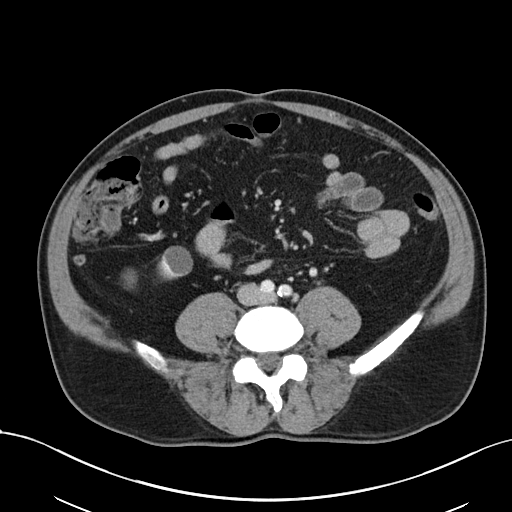
[im 49/87  soft-tissue]
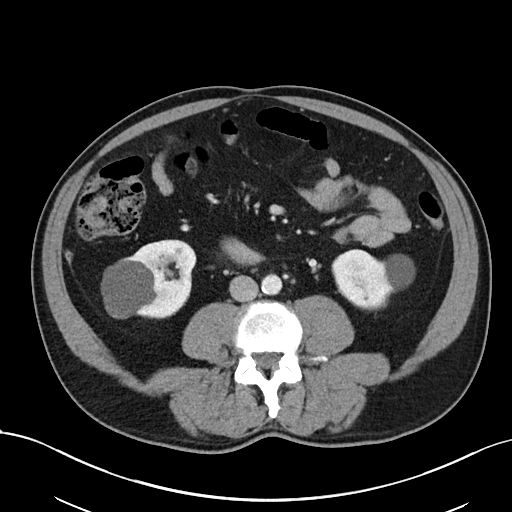
[im 54/87  soft-tissue]
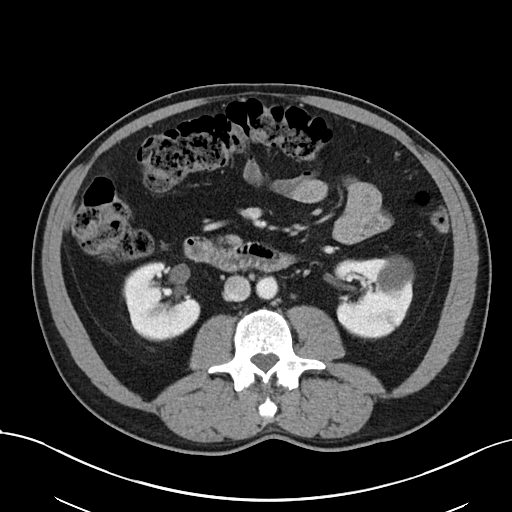
[im 54/87  bone]
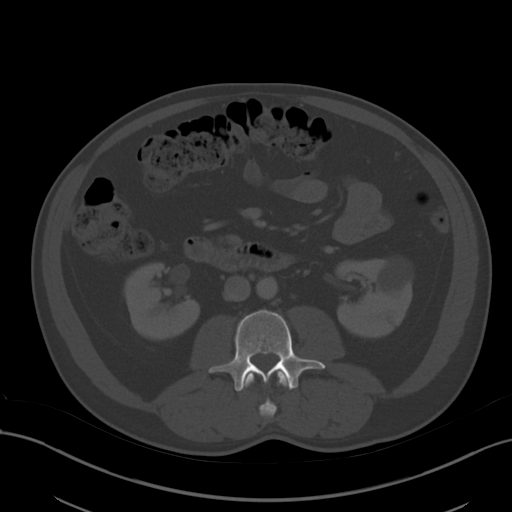
[im 60/87  soft-tissue]
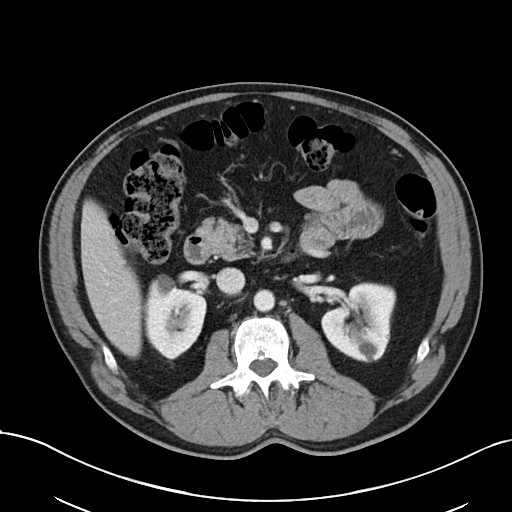
[im 70/87  soft-tissue]
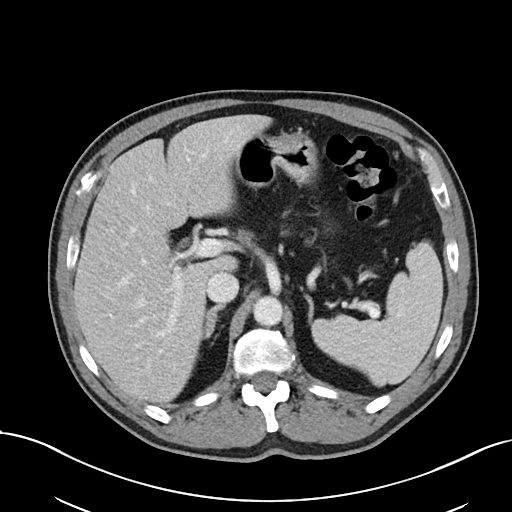
[im 76/87  soft-tissue]
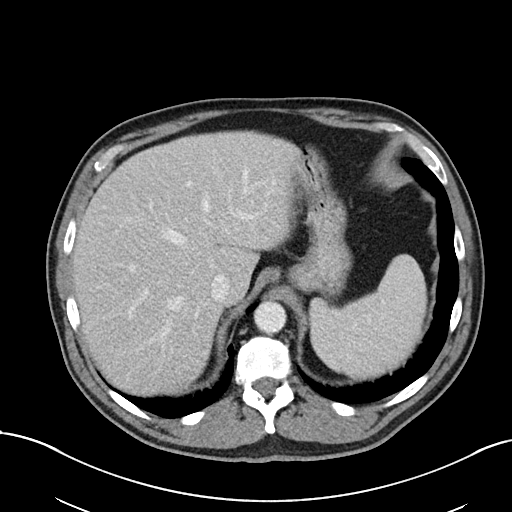
[im 81/87  soft-tissue]
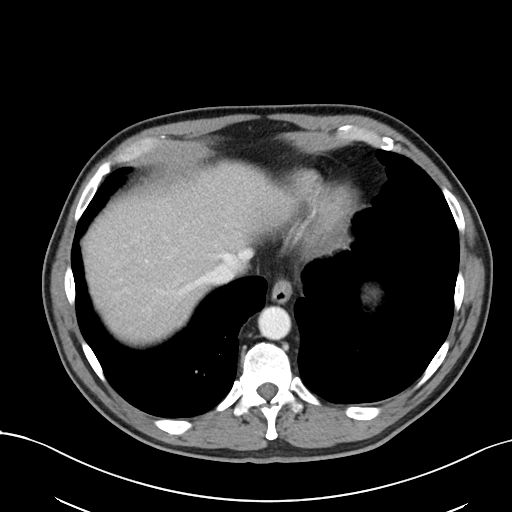

[Series 5: abdomen 3.0 mpr cor · coronal · 0.76mm/px · 3 of 101 slices shown]
[im 34/101  soft-tissue]
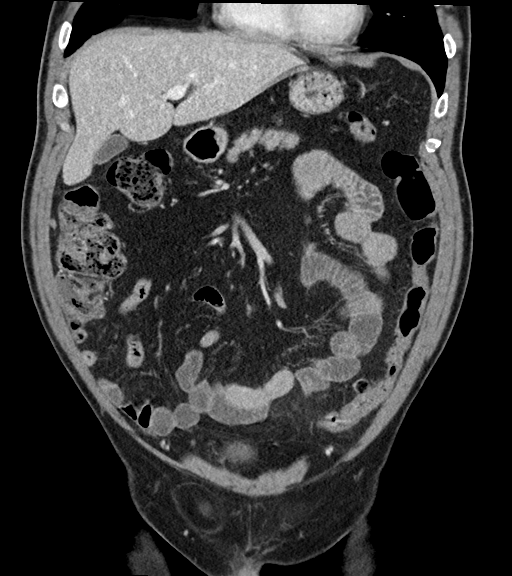
[im 45/101  soft-tissue]
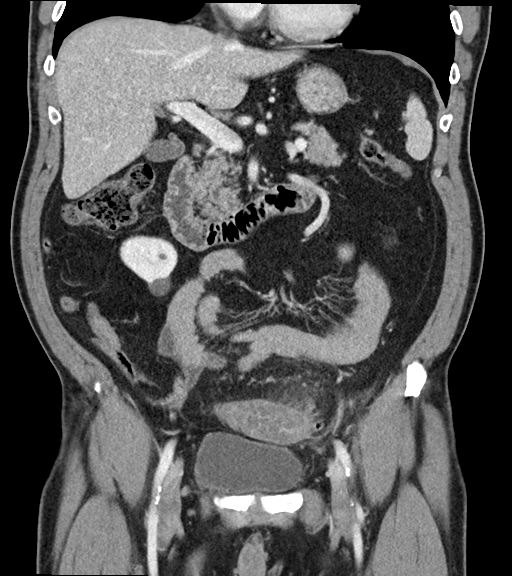
[im 56/101  soft-tissue]
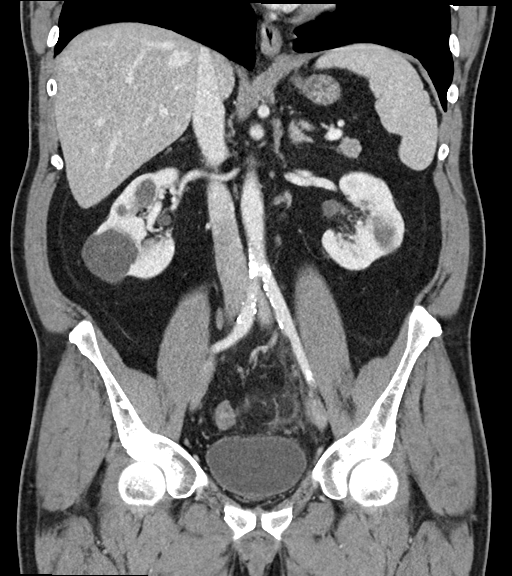

[16 of 46 positions shown; findings below may reference images not displayed]

RADIATION DOSE REDUCTION: This exam was performed according to the
departmental dose-optimization program which includes automated
exposure control, adjustment of the mA and/or kV according to
patient size and/or use of iterative reconstruction technique.

CONTRAST:  100mL OMNIPAQUE IOHEXOL 300 MG/ML  SOLN
FINDINGS: Lower chest: Normal except for a linear scar at the right lung base.

Hepatobiliary: Normal

Pancreas: Normal

Spleen: Normal

Adrenals/Urinary Tract: Adrenal glands are normal. Numerous simple
appearing renal cysts. No stone or hydronephrosis. The bladder is
intrinsically normal, but does extend partially into a right
inguinal hernia.

Stomach/Bowel: Stomach and small bowel are normal. Normal appendix.
There is diverticulosis of the left colon with acute diverticulitis
in the sigmoid region. There is a 7 8 cm segment of bowel wall
thickening with surrounding inflammatory edema. No evidence of
abscess, free fluid or free air.

Vascular/Lymphatic: Aortic atherosclerosis. No aneurysm. IVC is
normal. No adenopathy.

Reproductive: Normal

Other: No other significant finding.

Musculoskeletal: Ordinary mild lower lumbar degenerative changes.
IMPRESSION: Acute diverticulitis of the sigmoid colon. Surrounding inflammatory
edema but without evidence of abscess, free fluid or free air at
this time.

Renal cysts as seen previously. Right inguinal hernia containing fat
and an extension of the urinary bladder.

Aortic Atherosclerosis ([id number]).
